# Patient Record
Sex: Male | Born: 1995 | Race: Black or African American | Hispanic: No | Marital: Single | Smoking: Never smoker
Health system: Southern US, Community
[De-identification: ages and names within clinical notes are randomized; demographics above are authoritative.]

## PROBLEM LIST (undated history)

## (undated) DIAGNOSIS — M24111 Other articular cartilage disorders, right shoulder: Secondary | ICD-10-CM

## (undated) DIAGNOSIS — M24419 Recurrent dislocation, unspecified shoulder: Secondary | ICD-10-CM

## (undated) DIAGNOSIS — T07XXXA Unspecified multiple injuries, initial encounter: Secondary | ICD-10-CM

## (undated) HISTORY — PX: NO PAST SURGERIES: SHX2092

---
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
## 2014-12-07 ENCOUNTER — Emergency Department (HOSPITAL_COMMUNITY)
Admission: EM | Admit: 2014-12-07 | Discharge: 2014-12-07 | Disposition: A | Discharge status: Home / Self Care | Payer: Medicaid Other | Attending: Emergency Medicine | Admitting: Emergency Medicine

## 2014-12-07 ENCOUNTER — Encounter (HOSPITAL_COMMUNITY): Payer: Self-pay | Admitting: Emergency Medicine

## 2014-12-07 ENCOUNTER — Emergency Department (HOSPITAL_COMMUNITY): Payer: Medicaid Other

## 2014-12-07 DIAGNOSIS — Y998 Other external cause status: Secondary | ICD-10-CM | POA: Insufficient documentation

## 2014-12-07 DIAGNOSIS — F419 Anxiety disorder, unspecified: Secondary | ICD-10-CM | POA: Insufficient documentation

## 2014-12-07 DIAGNOSIS — X58XXXA Exposure to other specified factors, initial encounter: Secondary | ICD-10-CM | POA: Diagnosis not present

## 2014-12-07 DIAGNOSIS — Y9389 Activity, other specified: Secondary | ICD-10-CM | POA: Diagnosis not present

## 2014-12-07 DIAGNOSIS — S43004A Unspecified dislocation of right shoulder joint, initial encounter: Secondary | ICD-10-CM

## 2014-12-07 DIAGNOSIS — Y929 Unspecified place or not applicable: Secondary | ICD-10-CM | POA: Insufficient documentation

## 2014-12-07 MED ORDER — HYDROCODONE-ACETAMINOPHEN 5-325 MG PO TABS: 1.0000 | ORAL_TABLET | Freq: Four times a day (QID) | ORAL | Status: DC | PRN

## 2014-12-07 MED ORDER — PROPOFOL 10 MG/ML IV BOLUS: 0.5000 mg/kg | Freq: Once | INTRAVENOUS | Status: AC

## 2014-12-07 MED ORDER — PROPOFOL 10 MG/ML IV BOLUS
INTRAVENOUS | Status: AC
  Filled 2014-12-07: qty 1

## 2014-12-07 MED ORDER — FENTANYL CITRATE (PF) 100 MCG/2ML IJ SOLN
100.0000 ug | Freq: Once | INTRAMUSCULAR | Status: AC
  Administered 2014-12-07: 100 ug via INTRAVENOUS
  Filled 2014-12-07: qty 2

## 2014-12-07 MED ORDER — PROPOFOL 10 MG/ML IV BOLUS
INTRAVENOUS | Status: AC | PRN
  Administered 2014-12-07: 20 mg via INTRAVENOUS
  Administered 2014-12-07: 60 mg via INTRAVENOUS

## 2014-12-07 NOTE — Sedation Documentation (Signed)
Pt conversing appropriately, baseline mentation however still drowsy, pt admits to improvement in pain.

## 2014-12-07 NOTE — ED Notes (Signed)
Pt was reaching for phone tonight and says, "I guess I reached too far." Has dislocated right shoulder in the past. Obvious deformity. No other c/c.

## 2014-12-07 NOTE — Discharge Instructions (Signed)
Shoulder Dislocation  Your shoulder is made up of three bones: the collar bone (clavicle); the shoulder blade (scapula), which includes the socket (glenoid cavity); and the upper arm bone (humerus). Your shoulder joint is the place where these bones meet. Strong, fibrous tissues hold these bones together (ligaments). Muscles and strong, fibrous tissues that connect the muscles to these bones (tendons) allow your arm to move through this joint. The range of motion of your shoulder joint is more extensive than most of your other joints, and the glenoid cavity is very shallow. That is the reason that your shoulder joint is one of the most unstable joints in your body. It is far more prone to dislocation than your other joints. Shoulder dislocation is when your humerus is forced out of your shoulder joint.  CAUSES  Shoulder dislocation is caused by a forceful impact on your shoulder. This impact usually is from an injury, such as a sports injury or a fall.  SYMPTOMS  Symptoms of shoulder dislocation include:  · Deformity of your shoulder.  · Intense pain.  · Inability to move your shoulder joint.  · Numbness, weakness, or tingling around your shoulder joint (your neck or down your arm).  · Bruising or swelling around your shoulder.  DIAGNOSIS  In order to diagnose a dislocated shoulder, your caregiver will perform a physical exam. Your caregiver also may have an X-ray exam done to see if you have any broken bones. Magnetic resonance imaging (MRI) is a procedure that sometimes is done to help your caregiver see any damage to the soft tissues around your shoulder, particularly your rotator cuff tendons. Additionally, your caregiver also may have electromyography done to measure the electrical discharges produced in your muscles if you have signs or symptoms of nerve damage.  TREATMENT  A shoulder dislocation is treated by placing the humerus back in the joint (reduction). Your caregiver does this either manually (closed  reduction), by moving your humerus back into the joint through manipulation, or through surgery (open reduction). When your humerus is back in place, severe pain should improve almost immediately.  You also may need to have surgery if you have a weak shoulder joint or ligaments, and you have recurring shoulder dislocations, despite rehabilitation. In rare cases, surgery is necessary if your nerves or blood vessels are damaged during the dislocation.  After your reduction, your arm will be placed in a shoulder immobilizer or sling to keep it from moving. Your caregiver will have you wear your shoulder immobilizer or sling for 3 days to 3 weeks, depending on how serious your dislocation is. When your shoulder immobilizer or sling is removed, your caregiver may prescribe physical therapy to help improve the range of motion in your shoulder joint.  HOME CARE INSTRUCTIONS   The following measures can help to reduce pain and speed up the healing process:  · Rest your injured joint. Do not move it. Avoid activities similar to the one that caused your injury.  · Apply ice to your injured joint for the first day or two after your reduction or as directed by your caregiver. Applying ice helps to reduce inflammation and pain.  ¨ Put ice in a plastic bag.  ¨ Place a towel between your skin and the bag.  ¨ Leave the ice on for 15-20 minutes at a time, every 2 hours while you are awake.  · Exercise your hand by squeezing a soft ball. This helps to eliminate stiffness and swelling in your hand and   wrist.  · Take over-the-counter or prescription medicine for pain or discomfort as told by your caregiver.  SEEK IMMEDIATE MEDICAL CARE IF:   · Your shoulder immobilizer or sling becomes damaged.  · Your pain becomes worse rather than better.  · You lose feeling in your arm or hand, or they become white and cold.  MAKE SURE YOU:   · Understand these instructions.  · Will watch your condition.  · Will get help right away if you are not  doing well or get worse.  Document Released: 03/31/2001 Document Revised: 11/20/2013 Document Reviewed: 04/26/2011  [employer]® Patient Information ©2015 [employer]. This information is not intended to replace advice given to you by your health care provider. Make sure you discuss any questions you have with your health care provider.

## 2014-12-07 NOTE — Sedation Documentation (Signed)
Right shoulder successfully reduced, pt tolerated procedure well.

## 2014-12-07 NOTE — ED Notes (Signed)
Arm sling in place. AVS explained in detail. No other c/c. Knows to follow up with Orthopedic surgeon. No other c/c.

## 2014-12-07 NOTE — ED Provider Notes (Signed)
CSN: [id number]     Arrival date & time 12/07/14  0020 History   First MD Initiated Contact with Patient 12/07/14 [phone]     Chief Complaint  Patient presents with  . Dislocation    shoulder, right     (Consider location/radiation/quality/duration/timing/severity/associated sxs/prior Treatment) HPI Patient presents with right shoulder dislocation. States he's had multiple dislocations in the past. Was unable to reduce the shoulder himself. States he was reaching for an object when he felt the shoulder come out of place. He has decreased range of motion of the right shoulder. No numbness. No other injury. History reviewed. No pertinent past medical history. History reviewed. No pertinent past surgical history. History reviewed. No pertinent family history. History  Substance Use Topics  . Smoking status: Never Smoker   . Smokeless tobacco: Not on file  . Alcohol Use: No    Review of Systems  Constitutional: Negative for fever and chills.  Respiratory: Negative for shortness of breath.   Cardiovascular: Negative for chest pain.  Gastrointestinal: Negative for nausea, vomiting and abdominal pain.  Musculoskeletal: Positive for arthralgias. Negative for myalgias, neck pain and neck stiffness.  Skin: Negative for rash and wound.  Neurological: Negative for dizziness, weakness, light-headedness, numbness and headaches.  All other systems reviewed and are negative.     Allergies  Review of patient's allergies indicates no known allergies.  Home Medications   Prior to Admission medications   Medication Sig Start Date End Date Taking? Authorizing Provider  ibuprofen (ADVIL,MOTRIN) 200 MG tablet Take 400 mg by mouth every 6 (six) hours as needed for moderate pain.   Yes Historical Provider, MD  HYDROcodone-acetaminophen (NORCO/VICODIN) 5-325 MG per tablet Take 1 tablet by mouth every 6 (six) hours as needed for severe pain. 12/07/14   Loren Raceravid Donnalynn Wheeless, MD  ibuprofen (ADVIL,MOTRIN) 800  MG tablet Take 1 tablet (800 mg total) by mouth 3 (three) times daily. Patient not taking: Reported on 12/07/2014 07/20/13   Fayrene HelperBowie Tran, PA-C   BP 139/62 mmHg  Pulse 56  Temp(Src) 97.7 F (36.5 C) (Oral)  Resp 18  Ht 5\' 8"  (1.727 m)  Wt 165 lb (74.844 kg)  BMI 25.09 kg/m2  SpO2 97% Physical Exam  Constitutional: He is oriented to person, place, and time. He appears well-developed and well-nourished. No distress.  Anxious appearing  HENT:  Head: Normocephalic and atraumatic.  Mouth/Throat: Oropharynx is clear and moist.  Eyes: EOM are normal. Pupils are equal, round, and reactive to light.  Neck: Normal range of motion. Neck supple.  Cardiovascular: Normal rate and regular rhythm.   Pulmonary/Chest: Effort normal and breath sounds normal. No respiratory distress. He has no wheezes. He has no rales. He exhibits no tenderness.  Abdominal: Soft. Bowel sounds are normal. He exhibits no distension and no mass. There is no tenderness. There is no rebound and no guarding.  Musculoskeletal: Normal range of motion. He exhibits no edema or tenderness.  Deformity of the right shoulder. Decreased range of motion. Distal pulses intact. Full range of motion of the right elbow and wrist.   Neurological: He is alert and oriented to person, place, and time.  5/5 grip strength bilaterally. Sensation is fully intact.  Skin: Skin is warm and dry. No rash noted. No erythema.  Psychiatric: He has a normal mood and affect. His behavior is normal.  Nursing note and vitals reviewed.   ED Course  Reduction of dislocation Date/Time: 12/07/2014 1:45 AM Performed by: Loren RacerYELVERTON, Tyjae Issa Authorized by: Loren RacerYELVERTON, Saqib Cazarez Consent: Written consent  obtained. Time out: Immediately prior to procedure a "time out" was called to verify the correct patient, procedure, equipment, support staff and site/side marked as required. Local anesthesia used: no Patient sedated: yes Sedatives: propofol Sedation start date/time:  12/07/2014 1:45 AM Sedation end date/time: 12/07/2014 1:55 AM Vitals: Vital signs were monitored during sedation. Patient tolerance: Patient tolerated the procedure well with no immediate complications Comments: Shoulder immobilizer placed after manual reduction   (including critical care time) Labs Review Labs Reviewed - No data to display  Imaging Review Dg Shoulder Right  12/07/2014   CLINICAL DATA:  Right shoulder dislocation postreduction.  EXAM: RIGHT SHOULDER - 2+ VIEW  COMPARISON:  Pre reduction views earlier this day.  FINDINGS: Anatomic alignment of the glenohumeral joint postreduction. Suspect Hill-Sachs impaction injury. The acromioclavicular joint is congruent.  IMPRESSION: Anatomic alignment postreduction.   Electronically Signed   By: Rubye OaksMelanie  Ehinger M.D.   On: 12/07/2014 02:44   Dg Shoulder Right  12/07/2014   CLINICAL DATA:  Dislocated shoulder while reaching for phone this afternoon, history of shoulder dislocation.  EXAM: RIGHT SHOULDER - 2+ VIEW  COMPARISON:  None.  FINDINGS: RIGHT anteroinferior shoulder dislocation without fracture deformity. No destructive bony lesions. Soft tissue planes are unremarkable.  IMPRESSION: RIGHT anteroinferior shoulder dislocation without fracture deformity.   Electronically Signed   By: Awilda Metroourtnay  Bloomer   On: 12/07/2014 01:19     EKG Interpretation None      MDM   Final diagnoses:  Shoulder dislocation, right, initial encounter   Patient thought advised to follow-up with orthopedist. Return precautions given.     Loren Raceravid Tiwatope Emmitt, MD 12/07/14 [phone]

## 2015-02-17 ENCOUNTER — Emergency Department (HOSPITAL_BASED_OUTPATIENT_CLINIC_OR_DEPARTMENT_OTHER): Payer: Medicaid Other

## 2015-02-17 ENCOUNTER — Emergency Department (HOSPITAL_BASED_OUTPATIENT_CLINIC_OR_DEPARTMENT_OTHER)
Admission: EM | Admit: 2015-02-17 | Discharge: 2015-02-17 | Disposition: A | Discharge status: Home / Self Care | Payer: Medicaid Other | Attending: Emergency Medicine | Admitting: Emergency Medicine

## 2015-02-17 ENCOUNTER — Encounter (HOSPITAL_BASED_OUTPATIENT_CLINIC_OR_DEPARTMENT_OTHER): Payer: Self-pay | Admitting: *Deleted

## 2015-02-17 DIAGNOSIS — Y929 Unspecified place or not applicable: Secondary | ICD-10-CM | POA: Diagnosis not present

## 2015-02-17 DIAGNOSIS — S4991XA Unspecified injury of right shoulder and upper arm, initial encounter: Secondary | ICD-10-CM | POA: Diagnosis present

## 2015-02-17 DIAGNOSIS — Y998 Other external cause status: Secondary | ICD-10-CM | POA: Insufficient documentation

## 2015-02-17 DIAGNOSIS — Y9389 Activity, other specified: Secondary | ICD-10-CM | POA: Diagnosis not present

## 2015-02-17 DIAGNOSIS — S43004A Unspecified dislocation of right shoulder joint, initial encounter: Secondary | ICD-10-CM

## 2015-02-17 DIAGNOSIS — X58XXXA Exposure to other specified factors, initial encounter: Secondary | ICD-10-CM | POA: Diagnosis not present

## 2015-02-17 DIAGNOSIS — S43014A Anterior dislocation of right humerus, initial encounter: Secondary | ICD-10-CM | POA: Diagnosis not present

## 2015-02-17 DIAGNOSIS — R001 Bradycardia, unspecified: Secondary | ICD-10-CM | POA: Insufficient documentation

## 2015-02-17 HISTORY — DX: Recurrent dislocation, unspecified shoulder: M24.419

## 2015-02-17 MED ORDER — HYDROMORPHONE HCL 1 MG/ML IJ SOLN
1.0000 mg | Freq: Once | INTRAMUSCULAR | Status: AC
  Administered 2015-02-17: 1 mg via INTRAVENOUS
  Filled 2015-02-17: qty 1

## 2015-02-17 NOTE — Discharge Instructions (Signed)
Please see orthopaedics! Wear sling as directed.  If you were given medicines take as directed.  If you are on coumadin or contraceptives realize their levels and effectiveness is altered by many different medicines.  If you have any reaction (rash, tongues swelling, other) to the medicines stop taking and see a physician.    If your blood pressure was elevated in the ER make sure you follow up for management with a primary doctor or return for chest pain, shortness of breath or stroke symptoms.  Please follow up as directed and return to the ER or see a physician for new or worsening symptoms.  Thank you. Filed Vitals:   02/17/15 2124 02/17/15 2251  BP: 137/82 141/73  Pulse: 58 51  Temp: 98.3 F (36.8 C)   TempSrc: Oral   Resp: 20 18  Height: 5' 8.5" (1.74 m)   Weight: 163 lb (73.936 kg)   SpO2: 99% 98%    Shoulder Dislocation  Shoulder dislocation is when your upper arm bone (humerus) is forced out of your shoulder joint. Your doctor will put your shoulder back into the joint by pulling on your arm or through surgery. Your arm will be placed in a shoulder immobilizer or sling. The shoulder immobilizer or sling holds your shoulder in place while it heals. HOME CARE   Rest your injured joint. Do not move it until instructed to do so.  Put ice on your injured joint as told by your doctor.  Put ice in a plastic bag.  Place a towel between your skin and the bag.  Leave the ice on for 15-20 minutes at a time, every 2 hours while you are awake.  Only take medicines as told by your doctor.  Squeeze a ball to exercise your hand. GET HELP RIGHT AWAY IF:   Your splint or sling becomes damaged.  Your pain becomes worse, not better.  You lose feeling in your arm or hand.  Your arm or hand becomes white or cold. MAKE SURE YOU:   Understand these instructions.  Will watch your condition.  Will get help right away if you are not doing well or get worse. Document Released:  09/28/2011 Document Reviewed: 09/28/2011 [facility] Patient Information 2015 [place], [place]. This information is not intended to replace advice given to you by your health care provider. Make sure you discuss any questions you have with your health care provider.

## 2015-02-17 NOTE — ED Notes (Signed)
I applied an immobilizer sling over paper scrub shirt. Patient comfort improved.

## 2015-02-17 NOTE — ED Provider Notes (Signed)
CSN: [id number]     Arrival date & time 02/17/15  2120 History  This chart was scribed for Blane Ohara, MD by Budd Palmer, ED Scribe. This patient was seen in room MH07/MH07 and the patient's care was started at 10:02 PM.    Chief Complaint  Patient presents with  . Shoulder Pain   The history is provided by the patient. No language interpreter was used.   HPI Comments: Warren Holland is a 19 y.o. male who presents to the Emergency Department complaining of right shoulder pain onset 2 hours ago. He states that the shoulder dislocated while he was lying down. He reports associated weakness and numbness of the right hand. She notes a PHx of shoulder dislocation, last episode 3 days ago. Usually he will pop it back in by himself. He has seen someone for this, but never set up a surgery appointment.  Past Medical History  Diagnosis Date  . Shoulder dislocation, recurrent    History reviewed. No pertinent past surgical history. No family history on file. History  Substance Use Topics  . Smoking status: Never Smoker   . Smokeless tobacco: Not on file  . Alcohol Use: No    Review of Systems  Musculoskeletal: Positive for myalgias and arthralgias.  Neurological: Positive for weakness and numbness.  All other systems reviewed and are negative.  Allergies  Review of patient's allergies indicates no known allergies.  Home Medications   Prior to Admission medications   Medication Sig Start Date End Date Taking? Authorizing Provider  HYDROcodone-acetaminophen (NORCO/VICODIN) 5-325 MG per tablet Take 1 tablet by mouth every 6 (six) hours as needed for severe pain. 12/07/14   Loren Racer, MD  ibuprofen (ADVIL,MOTRIN) 200 MG tablet Take 400 mg by mouth every 6 (six) hours as needed for moderate pain.    Historical Provider, MD  ibuprofen (ADVIL,MOTRIN) 800 MG tablet Take 1 tablet (800 mg total) by mouth 3 (three) times daily. Patient not taking: Reported on 12/07/2014 07/20/13   Fayrene Helper, PA-C   BP 141/73 mmHg  Pulse 51  Temp(Src) 98.3 F (36.8 C) (Oral)  Resp 18  Ht 5' 8.5" (1.74 m)  Wt 163 lb (73.936 kg)  BMI 24.42 kg/m2  SpO2 98% Physical Exam  Constitutional: He is oriented to person, place, and time. He appears well-developed and well-nourished. No distress.  HENT:  Head: Normocephalic and atraumatic.  Mouth/Throat: Oropharynx is clear and moist.  Eyes: Conjunctivae and EOM are normal. Pupils are equal, round, and reactive to light.  Neck: Normal range of motion. Neck supple. No tracheal deviation present.  Cardiovascular: Bradycardia present.   Pulmonary/Chest: Breath sounds normal. No respiratory distress.  Abdominal: Soft.  Musculoskeletal: Normal range of motion. He exhibits tenderness.  Right shoulder anterior, empty fossa, tender to palpation, internal rotated, nv intact distal  Neurological: He is alert and oriented to person, place, and time.  Skin: Skin is warm and dry.  Psychiatric: He has a normal mood and affect. His behavior is normal.  Nursing note and vitals reviewed.   ED Course  Reduction of dislocation Date/Time: 02/17/2015 11:07 PM Performed by: Blane Ohara Authorized by: Blane Ohara Consent: Verbal consent obtained. Consent given by: patient Patient understanding: patient states understanding of the procedure being performed Patient consent: the patient's understanding of the procedure matches consent given Patient identity confirmed: verbally with patient Local anesthesia used: no Patient sedated: no Patient tolerance: Patient tolerated the procedure well with no immediate complications Comments: Muscle relaxation technique, traction and ext  rotation right shoulder     DIAGNOSTIC STUDIES: Oxygen Saturation is 99% on RA, normal by my interpretation.    COORDINATION OF CARE: 10:18 PM - Tried to readjust the shoulder. Discussed plans to XR to confirm sucess. If unsuccessful, will give medication and re-attempt. Pt  advised of plan for treatment and pt agrees.  Labs Review Labs Reviewed - No data to display  Imaging Review Dg Shoulder Right  02/17/2015   CLINICAL DATA:  Postreduction.  EXAM: RIGHT SHOULDER - 2+ VIEW  COMPARISON:  Pre reduction earlier this day.  FINDINGS: Anatomic alignment postreduction. Probable Hill-Sachs impaction injury. No displaced bony [place]. The acromioclavicular joint is congruent.  IMPRESSION: Normal alignment postreduction. Probable Hill-Sachs impaction injury.   Electronically Signed   By: Rubye Oaks M.D.   On: 02/17/2015 22:55   Dg Shoulder Right  02/17/2015   CLINICAL DATA:  Right shoulder pain.  Previous dislocation.  EXAM: RIGHT SHOULDER - 2+ VIEW  COMPARISON:  12/07/2014  FINDINGS: Examination demonstrates anterior dislocation of the humeral head at the glenohumeral joint. No evidence of fracture.  IMPRESSION: Right shoulder anterior dislocation.   Electronically Signed   By: Elberta Fortis M.D.   On: 02/17/2015 22:15     EKG Interpretation None      MDM   Final diagnoses:  Shoulder dislocation, right, initial encounter   Patient presented with recurrent shoulder dislocation, low risk mechanism. Stressed follow-up with orthopedics, patient has not followed up in the past. Patient was reduced at bedside with muscle activation technique and a dose of pain medicine. Clinically and repeat x-ray reviewed showing normal alignment with possible Hill-Sachs deformity. Results and differential diagnosis were discussed with the patient/parent/guardian. Xrays were independently reviewed by myself.  Close follow up outpatient was discussed, comfortable with the plan.   Medications  HYDROmorphone (DILAUDID) injection 1 mg (1 mg Intravenous Given 02/17/15 2158)    Filed Vitals:   02/17/15 2124 02/17/15 2251  BP: 137/82 141/73  Pulse: 58 51  Temp: 98.3 F (36.8 C)   TempSrc: Oral   Resp: 20 18  Height: 5' 8.5" (1.74 m)   Weight: 163 lb (73.936 kg)   SpO2: 99%  98%    Final diagnoses:  Shoulder dislocation, right, initial encounter      Blane Ohara, MD 02/17/15 2307

## 2015-02-17 NOTE — ED Notes (Signed)
Pt here with pain and obvious deformity of right shoulder.  Pt has had shoulder dislocated 3 days ago already

## 2015-02-18 DIAGNOSIS — M24419 Recurrent dislocation, unspecified shoulder: Secondary | ICD-10-CM

## 2015-02-18 DIAGNOSIS — M24111 Other articular cartilage disorders, right shoulder: Secondary | ICD-10-CM

## 2015-02-18 HISTORY — DX: Other articular cartilage disorders, right shoulder: M24.111

## 2015-02-18 HISTORY — DX: Recurrent dislocation, unspecified shoulder: M24.419

## 2015-02-25 ENCOUNTER — Emergency Department (HOSPITAL_COMMUNITY): Payer: Medicaid Other

## 2015-02-25 ENCOUNTER — Encounter (HOSPITAL_COMMUNITY): Payer: Self-pay | Admitting: Emergency Medicine

## 2015-02-25 ENCOUNTER — Emergency Department (HOSPITAL_COMMUNITY)
Admission: EM | Admit: 2015-02-25 | Discharge: 2015-02-25 | Disposition: A | Discharge status: Home / Self Care | Payer: Medicaid Other | Attending: Emergency Medicine | Admitting: Emergency Medicine

## 2015-02-25 DIAGNOSIS — S43031A Inferior subluxation of right humerus, initial encounter: Secondary | ICD-10-CM | POA: Diagnosis not present

## 2015-02-25 DIAGNOSIS — S43011A Anterior subluxation of right humerus, initial encounter: Secondary | ICD-10-CM | POA: Insufficient documentation

## 2015-02-25 DIAGNOSIS — Y939 Activity, unspecified: Secondary | ICD-10-CM | POA: Insufficient documentation

## 2015-02-25 DIAGNOSIS — S4991XA Unspecified injury of right shoulder and upper arm, initial encounter: Secondary | ICD-10-CM | POA: Diagnosis present

## 2015-02-25 DIAGNOSIS — X58XXXA Exposure to other specified factors, initial encounter: Secondary | ICD-10-CM | POA: Diagnosis not present

## 2015-02-25 DIAGNOSIS — S43004A Unspecified dislocation of right shoulder joint, initial encounter: Secondary | ICD-10-CM

## 2015-02-25 DIAGNOSIS — Y929 Unspecified place or not applicable: Secondary | ICD-10-CM | POA: Insufficient documentation

## 2015-02-25 DIAGNOSIS — Y998 Other external cause status: Secondary | ICD-10-CM | POA: Insufficient documentation

## 2015-02-25 MED ORDER — PROPOFOL 10 MG/ML IV BOLUS
100.0000 mg | Freq: Once | INTRAVENOUS | Status: AC
  Administered 2015-02-25: 80 mg via INTRAVENOUS
  Filled 2015-02-25: qty 1

## 2015-02-25 MED ORDER — LIDOCAINE-EPINEPHRINE 1 %-1:100000 IJ SOLN
10.0000 mL | Freq: Once | INTRAMUSCULAR | Status: DC
  Filled 2015-02-25: qty 1

## 2015-02-25 MED ORDER — PROPOFOL 10 MG/ML IV BOLUS
60.0000 mg | Freq: Once | INTRAVENOUS | Status: AC
  Administered 2015-02-25: 150 mg via INTRAVENOUS
  Filled 2015-02-25: qty 1

## 2015-02-25 MED ORDER — LIDOCAINE HCL (PF) 1 % IJ SOLN
30.0000 mL | Freq: Once | INTRAMUSCULAR | Status: DC
  Filled 2015-02-25: qty 30

## 2015-02-25 MED ORDER — LIDOCAINE HCL 2 % IJ SOLN
INTRAMUSCULAR | Status: AC
  Administered 2015-02-25: 800 mg
  Filled 2015-02-25: qty 20

## 2015-02-25 MED ORDER — KETAMINE HCL 10 MG/ML IJ SOLN
40.0000 mg | Freq: Once | INTRAMUSCULAR | Status: AC
  Administered 2015-02-25: 40 mg via INTRAVENOUS
  Filled 2015-02-25: qty 4

## 2015-02-25 MED ORDER — MIDAZOLAM HCL 2 MG/2ML IJ SOLN
5.0000 mg | Freq: Once | INTRAMUSCULAR | Status: AC
  Administered 2015-02-25: 5 mg via INTRAVENOUS
  Filled 2015-02-25: qty 6

## 2015-02-25 MED ORDER — ONDANSETRON HCL 4 MG/2ML IJ SOLN
4.0000 mg | Freq: Once | INTRAMUSCULAR | Status: AC
  Administered 2015-02-25: 4 mg via INTRAVENOUS
  Filled 2015-02-25: qty 2

## 2015-02-25 MED ORDER — HYDROMORPHONE HCL 1 MG/ML IJ SOLN
0.5000 mg | INTRAMUSCULAR | Status: DC | PRN
  Administered 2015-02-25: 0.5 mg via INTRAVENOUS
  Filled 2015-02-25: qty 1

## 2015-02-25 MED ORDER — KETAMINE HCL 10 MG/ML IJ SOLN
45.0000 mg | Freq: Once | INTRAMUSCULAR | Status: AC
  Administered 2015-02-25: 45 mg via INTRAVENOUS
  Filled 2015-02-25: qty 4.5

## 2015-02-25 MED ORDER — HYDROCODONE-ACETAMINOPHEN 5-325 MG PO TABS: 1.0000 | ORAL_TABLET | ORAL | Status: DC | PRN

## 2015-02-25 NOTE — ED Notes (Addendum)
Pt awake. Verbally responsive. A/O x4. Resp even and unlabored. No audible adventitious breath sounds noted. Pt able to move unaffected ext. RASS score 14. GCS-14.

## 2015-02-25 NOTE — ED Notes (Addendum)
1005-time out procedure completed.VS:BP:136/84, HR-74, RR-18, Sats-98% RA, Cap-35 1009-Moderate sedation started. Ketamine  IV push given via Dr. Clarene Duke 1010-Propofol  IV push given via Dr. Clarene Duke 1015-Dr. Little administered Propofol  IV push. 1020-Dr. Little administered Propofol  IV push. 1021-Dr. Little administered Propofol  IV push. Monitor VS throughout procedure. Dr. Clarene Duke and Donald Siva tech at bedside to attempt to reduce rt shoulder without success. Pt tolerated procedure well.

## 2015-02-25 NOTE — ED Notes (Addendum)
Dr. Clarene Duke at bedside attempting to do reduction to rt shoulder.

## 2015-02-25 NOTE — ED Notes (Signed)
Pt given verbally consent to this nurse and Dr. Clarene Duke prior to administering Versed for rt shoulder reduction.

## 2015-02-25 NOTE — Progress Notes (Signed)
Medicaid [place] access response history indicates pt''s pcp is  [facility] [employer], [place]  [address] [place], [place] [address] [phone]  EPIC updated

## 2015-02-25 NOTE — Discharge Instructions (Signed)
Shoulder Dislocation  Your shoulder is made up of three bones: the collar bone (clavicle); the shoulder blade (scapula), which includes the socket (glenoid cavity); and the upper arm bone (humerus). Your shoulder joint is the place where these bones meet. Strong, fibrous tissues hold these bones together (ligaments). Muscles and strong, fibrous tissues that connect the muscles to these bones (tendons) allow your arm to move through this joint. The range of motion of your shoulder joint is more extensive than most of your other joints, and the glenoid cavity is very shallow. That is the reason that your shoulder joint is one of the most unstable joints in your body. It is far more prone to dislocation than your other joints. Shoulder dislocation is when your humerus is forced out of your shoulder joint.  CAUSES  Shoulder dislocation is caused by a forceful impact on your shoulder. This impact usually is from an injury, such as a sports injury or a fall.  SYMPTOMS  Symptoms of shoulder dislocation include:  · Deformity of your shoulder.  · Intense pain.  · Inability to move your shoulder joint.  · Numbness, weakness, or tingling around your shoulder joint (your neck or down your arm).  · Bruising or swelling around your shoulder.  DIAGNOSIS  In order to diagnose a dislocated shoulder, your caregiver will perform a physical exam. Your caregiver also may have an X-ray exam done to see if you have any broken bones. Magnetic resonance imaging (MRI) is a procedure that sometimes is done to help your caregiver see any damage to the soft tissues around your shoulder, particularly your rotator cuff tendons. Additionally, your caregiver also may have electromyography done to measure the electrical discharges produced in your muscles if you have signs or symptoms of nerve damage.  TREATMENT  A shoulder dislocation is treated by placing the humerus back in the joint (reduction). Your caregiver does this either manually (closed  reduction), by moving your humerus back into the joint through manipulation, or through surgery (open reduction). When your humerus is back in place, severe pain should improve almost immediately.  You also may need to have surgery if you have a weak shoulder joint or ligaments, and you have recurring shoulder dislocations, despite rehabilitation. In rare cases, surgery is necessary if your nerves or blood vessels are damaged during the dislocation.  After your reduction, your arm will be placed in a shoulder immobilizer or sling to keep it from moving. Your caregiver will have you wear your shoulder immobilizer or sling for 3 days to 3 weeks, depending on how serious your dislocation is. When your shoulder immobilizer or sling is removed, your caregiver may prescribe physical therapy to help improve the range of motion in your shoulder joint.  HOME CARE INSTRUCTIONS   The following measures can help to reduce pain and speed up the healing process:  · Rest your injured joint. Do not move it. Avoid activities similar to the one that caused your injury.  · Apply ice to your injured joint for the first day or two after your reduction or as directed by your caregiver. Applying ice helps to reduce inflammation and pain.  ¨ Put ice in a plastic bag.  ¨ Place a towel between your skin and the bag.  ¨ Leave the ice on for 15-20 minutes at a time, every 2 hours while you are awake.  · Exercise your hand by squeezing a soft ball. This helps to eliminate stiffness and swelling in your hand and   wrist.  · Take over-the-counter or prescription medicine for pain or discomfort as told by your caregiver.  SEEK IMMEDIATE MEDICAL CARE IF:   · Your shoulder immobilizer or sling becomes damaged.  · Your pain becomes worse rather than better.  · You lose feeling in your arm or hand, or they become white and cold.  MAKE SURE YOU:   · Understand these instructions.  · Will watch your condition.  · Will get help right away if you are not  doing well or get worse.  Document Released: 03/31/2001 Document Revised: 11/20/2013 Document Reviewed: 04/26/2011  [employer]® Patient Information ©2015 [employer]. This information is not intended to replace advice given to you by your health care provider. Make sure you discuss any questions you have with your health care provider.

## 2015-02-25 NOTE — ED Notes (Signed)
Awake. Verbally responsive. A/O x4. Resp even and unlabored. No audible adventitious breath sounds noted. ABC's intact.  

## 2015-02-25 NOTE — ED Notes (Signed)
Dr. Linna Caprice at bedside.

## 2015-02-25 NOTE — ED Notes (Signed)
Patient states he woke up this AM, stretched and dislocated his right shoulder. Patient states he has had this to happen before.

## 2015-02-25 NOTE — ED Provider Notes (Addendum)
CSN: [id number]     Arrival date & time 02/25/15  4098 History   First MD Initiated Contact with Patient 02/25/15 [phone]     Chief Complaint  Patient presents with  . Shoulder Injury     (Consider location/radiation/quality/duration/timing/severity/associated sxs/prior Treatment) HPI Comments: 19yo M w/ h/o R shoulder dislocation p/w R shoulder pain. Just prior to arrival, the patient woke up from sleep and dislocated his R shoulder. He denies any trauma but states that he was lying down in bed when it happened. Pain is severe and constant, worse with arm movement. Last dislocation was 3 days ago. He has not yet set up an appointment with orthopaedics but was trying to get PCP appt for referral.  Patient is a 19 y.o. male presenting with shoulder injury. The history is provided by the patient.  Shoulder Injury    Past Medical History  Diagnosis Date  . Shoulder dislocation, recurrent    History reviewed. No pertinent past surgical history. History reviewed. No pertinent family history. History  Substance Use Topics  . Smoking status: Never Smoker   . Smokeless tobacco: Not on file  . Alcohol Use: No    Review of Systems  10 Systems reviewed and are negative for acute change except as noted in the HPI.   Allergies  Review of patient's allergies indicates no known allergies.  Home Medications   Prior to Admission medications   Medication Sig Start Date End Date Taking? Authorizing Provider  HYDROcodone-acetaminophen (NORCO/VICODIN) 5-325 MG per tablet Take 1 tablet by mouth every 4 (four) hours as needed. 02/25/15   Laurence Spates, MD  ibuprofen (ADVIL,MOTRIN) 800 MG tablet Take 1 tablet (800 mg total) by mouth 3 (three) times daily. Patient not taking: Reported on 12/07/2014 07/20/13   Fayrene Helper, PA-C   BP 127/68 mmHg  Pulse 62  Temp(Src) 97.9 F (36.6 C) (Oral)  Resp 18  SpO2 99% Physical Exam  Constitutional: He is oriented to person, place, and time. He appears  well-developed and well-nourished. No distress.  Holding R arm, in distress due to pain  HENT:  Head: Normocephalic and atraumatic.  Moist mucous membranes  Eyes: Conjunctivae are normal. Pupils are equal, round, and reactive to light.  Neck: Neck supple.  Cardiovascular: Normal rate, regular rhythm and normal heart sounds.   No murmur heard. Pulmonary/Chest: Effort normal and breath sounds normal.  Abdominal: Soft. Bowel sounds are normal. He exhibits no distension. There is no tenderness.  Musculoskeletal: He exhibits no edema.  R arm held in abduction and flexed at elbow; obvious deformity to R shoulder w/ asymmetry compared to L; 2+ radial pulses and normal sensation throughout  Neurological: He is alert and oriented to person, place, and time.  Fluent speech  Skin: Skin is warm and dry.  Psychiatric: Judgment normal.  distressed  Nursing note and vitals reviewed.   ED Course  Procedural sedation Date/Time: 02/25/2015 10:20 AM Performed by: Laurence Spates Authorized by: Laurence Spates Consent: Verbal consent obtained. Written consent obtained. Risks and benefits: risks, benefits and alternatives were discussed Consent given by: patient Patient understanding: patient states understanding of the procedure being performed Patient consent: the patient's understanding of the procedure matches consent given Procedure consent: procedure consent matches procedure scheduled Relevant documents: relevant documents present and verified Patient identity confirmed: verbally with patient and arm band Time out: Immediately prior to procedure a "time out" was called to verify the correct patient, procedure, equipment, support staff and site/side marked as required.  Local anesthesia used: no Patient sedated: yes Sedatives: ketamine and propofol Vitals: Vital signs were monitored during sedation. Patient tolerance: Patient tolerated the procedure well with no immediate  complications Comments: Initial sedation performed with 40mg  IV ketamine and 120mg  IV propofol; multiple attempts made at reduction of shoulder without success  Length of time: 15 minutes   Procedural sedation Date/Time: 02/25/2015 13:00 PM Performed by: Laurence Spates Authorized by: Laurence Spates Consent: Verbal consent obtained. Written consent obtained. Risks and benefits: risks, benefits and alternatives were discussed Consent given by: patient Patient understanding: patient states understanding of the procedure being performed Patient consent: the patient's understanding of the procedure matches consent given Procedure consent: procedure consent matches procedure scheduled Relevant documents: relevant documents present and verified Patient identity confirmed: verbally with patient and arm band Time out: Immediately prior to procedure a "time out" was called to verify the correct patient, procedure, equipment, support staff and site/side marked as required. Local anesthesia used: no Patient sedated: yes Sedatives: ketamine and propofol Vitals: Vital signs were monitored during sedation. Length of time: 5 minutes Patient tolerance: Patient tolerated the procedure well with no immediate complications Comments:sedation performed with ketamine and propofol; orthopaedic surgeon at bedside successfully reduced shoulder at bedside, confirmed with repeat XR.   Labs Review Labs Reviewed - No data to display  Imaging Review Dg Shoulder Right  02/25/2015   CLINICAL DATA:  Right shoulder dislocation  EXAM: RIGHT SHOULDER - 2+ VIEW  COMPARISON:  02/17/2015  FINDINGS: Two views of the right shoulder submitted. There is recurrent anterior inferior right shoulder subluxation. No fracture is noted.  IMPRESSION: Recurrent anterior inferior right shoulder subluxation. No acute fracture.   Electronically Signed   By: Natasha Mead M.D.   On: 02/25/2015 09:23   Dg Shoulder Right  Port  02/25/2015   CLINICAL DATA:  19 year old male post reduction. Subsequent encounter.  EXAM: PORTABLE RIGHT SHOULDER - 2+ VIEW  COMPARISON:  02/25/2015 right shoulder films.  FINDINGS: Reduction right humeral head.  Hill-Sachs deformity.  Visualized lungs clear.  IMPRESSION: Reduction right humeral head.  Hill-Sachs deformity.   Electronically Signed   By: Lacy Duverney M.D.   On: 02/25/2015 13:10     EKG Interpretation None      MDM   Final diagnoses:  Shoulder joint dislocation, right, initial encounter  Hill-Sachs deformity  19yo M w/ recurrent R shoulder dislocation p/w atraumatic dislocation that occurred while he was in bed this morning. Patient with obvious dislocation compared to left shoulder on exam. Neurovascularly intact. Attempted relocation with 5 mg IV Versed, 10 mg intra-articular 1% lidocaine, and massage/abduction/external rotation without success. Obtained plain films of shoulder and Consented for procedural sedation. Initial procedural sedation performed with ketamine and propofol and I was unable to reduce the shoulder using traction-countertraction with the assistance of the Ortho tech. Consult orthopedics for evaluation. Dr. Linna Caprice reviewed films and we performed a second sedation during which he successfully relocated the right shoulder. Place the patient in a sling. Repeat films show successful relocation with a Hill-Sachs deformity. He provided the family with follow-up for shoulder specialist with [facility] orthopedics. Patient tolerated sedation well and was awake and well-appearing at time of discharge. Return precautions reviewed. Patient discharged in satisfactory condition.  Laurence Spates, MD 02/25/15 1727  Laurence Spates, MD 03/15/15 [id number]

## 2015-02-25 NOTE — Consult Note (Signed)
   ORTHOPAEDIC CONSULTATION  REQUESTING PHYSICIAN: Laurence Spates, MD  PCP:  Default, Provider, MD  Chief Complaint: recurrent right shoulder dislocation  HPI: Warren Holland is a 19 y.o. male who complains of right shoulder dislocation. Has multiple dislocations; he can usually reduce himself. This am, dislocation happened while stretching. First dislocation was 3 years ago whole playing football. Had closed reduction a few days ago; has never seen orthopaedic surgeon. Unsuccessful attempt at CR in the ED earlier today.  Past Medical History  Diagnosis Date  . Shoulder dislocation, recurrent    History reviewed. No pertinent past surgical history. History   Social History  . Marital Status: Single    Spouse Name: N/A  . Number of Children: N/A  . Years of Education: N/A   Social History Main Topics  . Smoking status: Never Smoker   . Smokeless tobacco: Not on file  . Alcohol Use: No  . Drug Use: No  . Sexual Activity: Not on file   Other Topics Concern  . None   Social History Narrative   History reviewed. No pertinent family history. No Known Allergies Prior to Admission medications   Medication Sig Start Date End Date Taking? Authorizing Provider  HYDROcodone-acetaminophen (NORCO/VICODIN) 5-325 MG per tablet Take 1 tablet by mouth every 6 (six) hours as needed for severe pain. Patient not taking: Reported on 02/25/2015 12/07/14   Loren Racer, MD  ibuprofen (ADVIL,MOTRIN) 800 MG tablet Take 1 tablet (800 mg total) by mouth 3 (three) times daily. Patient not taking: Reported on 12/07/2014 07/20/13   Fayrene Helper, PA-C   Dg Shoulder Right  02/25/2015   CLINICAL DATA:  Right shoulder dislocation  EXAM: RIGHT SHOULDER - 2+ VIEW  COMPARISON:  02/17/2015  FINDINGS: Two views of the right shoulder submitted. There is recurrent anterior inferior right shoulder subluxation. No fracture is noted.  IMPRESSION: Recurrent anterior inferior right shoulder subluxation. No acute  fracture.   Electronically Signed   By: Natasha Mead M.D.   On: 02/25/2015 09:23    Positive ROS: All other systems have been reviewed and were otherwise negative with the exception of those mentioned in the HPI and as above.  Physical Exam: General: Alert, no acute distress Cardiovascular: No pedal edema Respiratory: No cyanosis, no use of accessory musculature GI: No organomegaly, abdomen is soft and non-tender Skin: No lesions in the area of chief complaint Neurologic: Sensation intact distally Psychiatric: Patient is competent for consent with normal mood and affect Lymphatic: No axillary or cervical lymphadenopathy  MUSCULOSKELETAL: R shoulder deformity. Pain with attempted motion. NVI.  Assessment: RIGHT glenohumeral dislocation, recurrent.  Plan: Closed reduction in the ED. NV exam unchanged. Xrays showed successful reduction. Wear immobilizer at all times. F/u in the office with Dr. Ranell Patrick, [place], or [place] in 7-10 days, call to schedule.    [name], Cloyde Reams, MD Cell [phone]    02/25/2015 12:00 PM

## 2015-03-14 ENCOUNTER — Encounter (HOSPITAL_BASED_OUTPATIENT_CLINIC_OR_DEPARTMENT_OTHER): Payer: Self-pay | Admitting: *Deleted

## 2015-03-14 DIAGNOSIS — T07XXXA Unspecified multiple injuries, initial encounter: Secondary | ICD-10-CM

## 2015-03-14 HISTORY — DX: Unspecified multiple injuries, initial encounter: T07.XXXA

## 2015-03-18 ENCOUNTER — Other Ambulatory Visit: Payer: Self-pay | Admitting: Physician Assistant

## 2015-03-18 NOTE — H&P (Signed)
Warren Holland is a new patient to the office.  Presents for evaluation and treatment recommendation for his right shoulder.  Initial injury playing football in high school.  Anteroinferior shoulder dislocation on the right.  Closed reduction.  From that time until now he has had persistent numerous episodes of instability and subluxation.  Unfortunately recently he had a true complete dislocation requiring a trip to the emergency room and closed reduction.  That occurred on February 25, 2015.  I have looked at notes, as well as the films, confirming anteroinferior dislocation with Hill Sachs lesion.  Acceptable reduction.  He comes in to discuss definitive treatment.  He realizes this has gotten to a point of intolerable instability and he would like to get this fixed.  He was actually scheduled to be seen in our office for persistent instability in both 2014 and 2015.  He did not come in for those based on the fact that he did not feel he was symptomatic enough to come in to discuss definitive operative treatment.  He has definitely reached that point now.  He is currently working HVAC work, both residential and commercial.  His history, outside films and reports were reviewed.  I have reviewed previous office notes.  He is otherwise in great health.    EXAMINATION: General exam is outlined and included in the chart.  Specifically, healthy appearing 19 year-old male.  Height: 5?9.  Weight: 165 pounds. Neurovascularly intact upper and lower extremities.  He is so unstable he is wearing a sling at all times on his right shoulder.  Even with the slightest motion he has marked instability.  His cuff is irritated, but I think it is intact.  No apprehension or instability with the left shoulder.    DISPOSITION:  Spent more than 25 minutes face-to-face with Warren Holland and family, who are with him.  The need for treatment is straightforward.  He completely understands and agrees.  We have discussed an arthrogram MRI to  delineate pathology and to make sure that there is nothing else unexpected.  He is so unstable I definitely want to look at his cuff to make sure he doesn't have an associated cuff tear.  I went through the process of an arthroscopic Bankart reconstruction.  Procedure, risks, benefits and complications reviewed.  Anticipated intraoperative and postoperative course reviewed.  The importance of protecting this until it is healed and then doing rehab thoroughly reinforced.  Based on his job where he states there is really not light duty I am anticipating him being out of work for 12 weeks post-op.  Paperwork complete.  He is going to be calling me after  the MR arthrogram is complete just to cover final plans and then I will see him at the time of operative intervention. He current cannot work and I want to get this done as quick as possible so we can get him recovered and rehabbed.    Daniel F. Murphy, M.D.    

## 2015-03-18 NOTE — H&P (Signed)
Warren Holland is a new patient to the office.  Presents for evaluation and treatment recommendation for his right shoulder.  Initial injury playing football in high school.  Anteroinferior shoulder dislocation on the right.  Closed reduction.  From that time until now he has had persistent numerous episodes of instability and subluxation.  Unfortunately recently he had a true complete dislocation requiring a trip to the emergency room and closed reduction.  That occurred on February 25, 2015.  I have looked at notes, as well as the films, confirming anteroinferior dislocation with Warren Holland lesion.  Acceptable reduction.  He comes in to discuss definitive treatment.  He realizes this has gotten to a point of intolerable instability and he would like to get this fixed.  He was actually scheduled to be seen in our office for persistent instability in both 2014 and 2015.  He did not come in for those based on the fact that he did not feel he was symptomatic enough to come in to discuss definitive operative treatment.  He has definitely reached that point now.  He is currently working [employer] work, both residential and commercial.  His history, outside films and reports were reviewed.  I have reviewed previous office notes.  He is otherwise in great health.    EXAMINATION: General exam is outlined and included in the chart.  Specifically, healthy appearing 19 year-old male.  Height: 5?9.  Weight: 165 pounds. Neurovascularly intact upper and lower extremities.  He is so unstable he is wearing a sling at all times on his right shoulder.  Even with the slightest motion he has marked instability.  His cuff is irritated, but I think it is intact.  No apprehension or instability with the left shoulder.    DISPOSITION:  Spent more than 25 minutes face-to-face with Warren Holland and family, who are with him.  The need for treatment is straightforward.  He completely understands and agrees.  We have discussed an arthrogram MRI to  delineate pathology and to make sure that there is nothing else unexpected.  He is so unstable I definitely want to look at his cuff to make sure he doesn't have an associated cuff tear.  I went through the process of an arthroscopic Bankart reconstruction.  Procedure, risks, benefits and complications reviewed.  Anticipated intraoperative and postoperative course reviewed.  The importance of protecting this until it is healed and then doing rehab thoroughly reinforced.  Based on his job where he states there is really not light duty I am anticipating him being out of work for 12 weeks post-op.  Paperwork complete.  He is going to be calling me after  the MR arthrogram is complete just to cover final plans and then I will see him at the time of operative intervention. He current cannot work and I want to get this done as quick as possible so we can get him recovered and rehabbed.    Loreta Ave, M.D.

## 2015-03-21 ENCOUNTER — Ambulatory Visit (HOSPITAL_BASED_OUTPATIENT_CLINIC_OR_DEPARTMENT_OTHER)
Admission: RE | Admit: 2015-03-21 | Discharge: 2015-03-21 | Disposition: A | Discharge status: Home / Self Care | Payer: Medicaid Other | Source: Ambulatory Visit | Attending: Orthopedic Surgery | Admitting: Orthopedic Surgery

## 2015-03-21 ENCOUNTER — Ambulatory Visit (HOSPITAL_BASED_OUTPATIENT_CLINIC_OR_DEPARTMENT_OTHER): Payer: Medicaid Other | Admitting: Anesthesiology

## 2015-03-21 ENCOUNTER — Encounter (HOSPITAL_BASED_OUTPATIENT_CLINIC_OR_DEPARTMENT_OTHER)
Admission: RE | Disposition: A | Discharge status: Home / Self Care | Payer: Self-pay | Source: Ambulatory Visit | Attending: Orthopedic Surgery

## 2015-03-21 ENCOUNTER — Encounter (HOSPITAL_BASED_OUTPATIENT_CLINIC_OR_DEPARTMENT_OTHER): Payer: Self-pay | Admitting: *Deleted

## 2015-03-21 DIAGNOSIS — M25811 Other specified joint disorders, right shoulder: Secondary | ICD-10-CM | POA: Insufficient documentation

## 2015-03-21 DIAGNOSIS — S43431A Superior glenoid labrum lesion of right shoulder, initial encounter: Secondary | ICD-10-CM | POA: Insufficient documentation

## 2015-03-21 DIAGNOSIS — Y92213 High school as the place of occurrence of the external cause: Secondary | ICD-10-CM | POA: Insufficient documentation

## 2015-03-21 DIAGNOSIS — Y9361 Activity, american tackle football: Secondary | ICD-10-CM | POA: Diagnosis not present

## 2015-03-21 DIAGNOSIS — X58XXXA Exposure to other specified factors, initial encounter: Secondary | ICD-10-CM | POA: Diagnosis not present

## 2015-03-21 DIAGNOSIS — M24411 Recurrent dislocation, right shoulder: Secondary | ICD-10-CM | POA: Insufficient documentation

## 2015-03-21 DIAGNOSIS — Y998 Other external cause status: Secondary | ICD-10-CM | POA: Diagnosis not present

## 2015-03-21 HISTORY — PX: SHOULDER ARTHROSCOPY WITH BANKART REPAIR: SHX5673

## 2015-03-21 HISTORY — DX: Other articular cartilage disorders, right shoulder: M24.111

## 2015-03-21 HISTORY — DX: Unspecified multiple injuries, initial encounter: T07.XXXA

## 2015-03-21 LAB — POCT HEMOGLOBIN-HEMACUE: HEMOGLOBIN: 14.6 g/dL (ref 13.0–17.0)

## 2015-03-21 SURGERY — SHOULDER ARTHROSCOPY WITH BANKART REPAIR
Anesthesia: General | Laterality: Right

## 2015-03-21 MED ORDER — HYDROMORPHONE HCL 1 MG/ML IJ SOLN
INTRAMUSCULAR | Status: AC
  Filled 2015-03-21: qty 1

## 2015-03-21 MED ORDER — METHOCARBAMOL 1000 MG/10ML IJ SOLN: 500.0000 mg | Freq: Four times a day (QID) | INTRAVENOUS | Status: DC | PRN

## 2015-03-21 MED ORDER — PROPOFOL 10 MG/ML IV BOLUS
INTRAVENOUS | Status: AC
  Filled 2015-03-21: qty 20

## 2015-03-21 MED ORDER — SCOPOLAMINE 1 MG/3DAYS TD PT72: 1.0000 | MEDICATED_PATCH | Freq: Once | TRANSDERMAL | Status: DC | PRN

## 2015-03-21 MED ORDER — DEXAMETHASONE SODIUM PHOSPHATE 4 MG/ML IJ SOLN
INTRAMUSCULAR | Status: DC | PRN
Start: 2015-03-21 — End: 2015-03-21
  Administered 2015-03-21: 10 mg via INTRAVENOUS

## 2015-03-21 MED ORDER — ONDANSETRON HCL 4 MG PO TABS: 4.0000 mg | ORAL_TABLET | Freq: Four times a day (QID) | ORAL | Status: DC | PRN

## 2015-03-21 MED ORDER — CHLORHEXIDINE GLUCONATE 4 % EX LIQD: 60.0000 mL | Freq: Once | CUTANEOUS | Status: DC

## 2015-03-21 MED ORDER — OXYCODONE-ACETAMINOPHEN 5-325 MG PO TABS: 1.0000 | ORAL_TABLET | ORAL | Status: DC | PRN

## 2015-03-21 MED ORDER — FENTANYL CITRATE (PF) 100 MCG/2ML IJ SOLN
INTRAMUSCULAR | Status: AC
  Filled 2015-03-21: qty 4

## 2015-03-21 MED ORDER — MIDAZOLAM HCL 2 MG/2ML IJ SOLN
INTRAMUSCULAR | Status: AC
  Filled 2015-03-21: qty 4

## 2015-03-21 MED ORDER — METOCLOPRAMIDE HCL 5 MG PO TABS: 5.0000 mg | ORAL_TABLET | Freq: Three times a day (TID) | ORAL | Status: DC | PRN

## 2015-03-21 MED ORDER — PROMETHAZINE HCL 25 MG/ML IJ SOLN: 6.2500 mg | INTRAMUSCULAR | Status: DC | PRN

## 2015-03-21 MED ORDER — MIDAZOLAM HCL 2 MG/2ML IJ SOLN
1.0000 mg | INTRAMUSCULAR | Status: DC | PRN
  Administered 2015-03-21: 2 mg via INTRAVENOUS

## 2015-03-21 MED ORDER — ONDANSETRON HCL 4 MG PO TABS: 4.0000 mg | ORAL_TABLET | Freq: Three times a day (TID) | ORAL | Status: DC | PRN

## 2015-03-21 MED ORDER — KETOROLAC TROMETHAMINE 30 MG/ML IJ SOLN
INTRAMUSCULAR | Status: AC
  Filled 2015-03-21: qty 1

## 2015-03-21 MED ORDER — DEXAMETHASONE SODIUM PHOSPHATE 10 MG/ML IJ SOLN
INTRAMUSCULAR | Status: AC
  Filled 2015-03-21: qty 1

## 2015-03-21 MED ORDER — SUCCINYLCHOLINE CHLORIDE 20 MG/ML IJ SOLN
INTRAMUSCULAR | Status: DC | PRN
  Administered 2015-03-21: 100 mg via INTRAVENOUS

## 2015-03-21 MED ORDER — LIDOCAINE HCL (CARDIAC) 20 MG/ML IV SOLN
INTRAVENOUS | Status: DC | PRN
Start: 2015-03-21 — End: 2015-03-21
  Administered 2015-03-21: 50 mg via INTRAVENOUS

## 2015-03-21 MED ORDER — ONDANSETRON 8 MG PO TBDP
ORAL_TABLET | ORAL | Status: AC
  Filled 2015-03-21: qty 1

## 2015-03-21 MED ORDER — ONDANSETRON 8 MG PO TBDP
8.0000 mg | ORAL_TABLET | Freq: Once | ORAL | Status: AC
  Administered 2015-03-21: 8 mg via ORAL

## 2015-03-21 MED ORDER — MORPHINE SULFATE (PF) 10 MG/ML IV SOLN
INTRAVENOUS | Status: AC
  Filled 2015-03-21: qty 1

## 2015-03-21 MED ORDER — MORPHINE SULFATE 10 MG/ML IJ SOLN
INTRAMUSCULAR | Status: DC | PRN
  Administered 2015-03-21: 2 mg via INTRAVENOUS

## 2015-03-21 MED ORDER — CEFAZOLIN SODIUM-DEXTROSE 2-3 GM-% IV SOLR
INTRAVENOUS | Status: AC
  Filled 2015-03-21: qty 50

## 2015-03-21 MED ORDER — ONDANSETRON HCL 4 MG/2ML IJ SOLN
INTRAMUSCULAR | Status: AC
  Filled 2015-03-21: qty 2

## 2015-03-21 MED ORDER — HYDROMORPHONE HCL 1 MG/ML IJ SOLN: 0.5000 mg | INTRAMUSCULAR | Status: DC | PRN

## 2015-03-21 MED ORDER — HYDROMORPHONE HCL 1 MG/ML IJ SOLN
0.2500 mg | INTRAMUSCULAR | Status: DC | PRN
  Administered 2015-03-21 (×4): 0.5 mg via INTRAVENOUS

## 2015-03-21 MED ORDER — LACTATED RINGERS IV SOLN
INTRAVENOUS | Status: DC
  Administered 2015-03-21: 11:00:00 via INTRAVENOUS

## 2015-03-21 MED ORDER — ONDANSETRON HCL 4 MG/2ML IJ SOLN: 4.0000 mg | Freq: Four times a day (QID) | INTRAMUSCULAR | Status: DC | PRN

## 2015-03-21 MED ORDER — SUCCINYLCHOLINE CHLORIDE 20 MG/ML IJ SOLN
INTRAMUSCULAR | Status: AC
  Filled 2015-03-21: qty 1

## 2015-03-21 MED ORDER — BUPIVACAINE HCL (PF) 0.25 % IJ SOLN
INTRAMUSCULAR | Status: DC | PRN
  Administered 2015-03-21: 20 mL

## 2015-03-21 MED ORDER — LACTATED RINGERS IV SOLN: INTRAVENOUS | Status: DC

## 2015-03-21 MED ORDER — GLYCOPYRROLATE 0.2 MG/ML IJ SOLN: 0.2000 mg | Freq: Once | INTRAMUSCULAR | Status: DC | PRN

## 2015-03-21 MED ORDER — KETOROLAC TROMETHAMINE 30 MG/ML IJ SOLN
INTRAMUSCULAR | Status: DC | PRN
Start: 2015-03-21 — End: 2015-03-21
  Administered 2015-03-21: 30 mg via INTRAVENOUS

## 2015-03-21 MED ORDER — FENTANYL CITRATE (PF) 100 MCG/2ML IJ SOLN
50.0000 ug | INTRAMUSCULAR | Status: DC | PRN
  Administered 2015-03-21: 100 ug via INTRAVENOUS

## 2015-03-21 MED ORDER — CEFAZOLIN SODIUM-DEXTROSE 2-3 GM-% IV SOLR: 2.0000 g | INTRAVENOUS | Status: DC

## 2015-03-21 MED ORDER — METHOCARBAMOL 500 MG PO TABS: 500.0000 mg | ORAL_TABLET | Freq: Four times a day (QID) | ORAL | Status: DC | PRN

## 2015-03-21 MED ORDER — PROPOFOL 10 MG/ML IV BOLUS
INTRAVENOUS | Status: DC | PRN
  Administered 2015-03-21: 250 mg via INTRAVENOUS

## 2015-03-21 MED ORDER — ONDANSETRON HCL 4 MG/2ML IJ SOLN
INTRAMUSCULAR | Status: DC | PRN
  Administered 2015-03-21: 4 mg via INTRAVENOUS

## 2015-03-21 MED ORDER — METOCLOPRAMIDE HCL 5 MG/ML IJ SOLN: 5.0000 mg | Freq: Three times a day (TID) | INTRAMUSCULAR | Status: DC | PRN

## 2015-03-21 SURGICAL SUPPLY — 74 items
ANCHOR SUT BIOCOMP LK 2.9X12.5 (Anchor) ×6 IMPLANT
BENZOIN TINCTURE PRP APPL 2/3 (GAUZE/BANDAGES/DRESSINGS) IMPLANT
BLADE CUTTER GATOR 3.5 (BLADE) ×2 IMPLANT
BLADE CUTTER MENIS 5.5 (BLADE) IMPLANT
BLADE GREAT WHITE 4.2 (BLADE) ×2 IMPLANT
BLADE MINI RND TIP GREEN BEAV (BLADE) IMPLANT
BLADE SURG 15 STRL LF DISP TIS (BLADE) IMPLANT
BLADE SURG 15 STRL SS (BLADE)
BUR OVAL 6.0 (BURR) ×2 IMPLANT
CANNULA 5.75X71 LONG (CANNULA) IMPLANT
CANNULA DRY DOC 8X75 (CANNULA) IMPLANT
CANNULA TWIST IN 8.25X7CM (CANNULA) IMPLANT
CANNULA TWIST IN 8.25X9CM (CANNULA) IMPLANT
DECANTER SPIKE VIAL GLASS SM (MISCELLANEOUS) IMPLANT
DRAPE STERI 35X30 U-POUCH (DRAPES) ×2 IMPLANT
DRAPE U-SHAPE 47X51 STRL (DRAPES) ×2 IMPLANT
DRAPE U-SHAPE 76X120 STRL (DRAPES) ×4 IMPLANT
DRSG PAD ABDOMINAL 8X10 ST (GAUZE/BANDAGES/DRESSINGS) ×2 IMPLANT
DURAPREP 26ML APPLICATOR (WOUND CARE) ×2 IMPLANT
ELECT MENISCUS 165MM 90D (ELECTRODE) ×2 IMPLANT
ELECT REM PT RETURN 9FT ADLT (ELECTROSURGICAL) ×2
ELECTRODE REM PT RTRN 9FT ADLT (ELECTROSURGICAL) ×1 IMPLANT
GAUZE SPONGE 4X4 12PLY STRL (GAUZE/BANDAGES/DRESSINGS) ×2 IMPLANT
GAUZE SPONGE 4X4 16PLY XRAY LF (GAUZE/BANDAGES/DRESSINGS) IMPLANT
GAUZE XEROFORM 1X8 LF (GAUZE/BANDAGES/DRESSINGS) ×2 IMPLANT
GLOVE BIOGEL PI IND STRL 7.0 (GLOVE) ×2 IMPLANT
GLOVE BIOGEL PI INDICATOR 7.0 (GLOVE) ×2
GLOVE ECLIPSE 7.0 STRL STRAW (GLOVE) ×2 IMPLANT
GLOVE SURG ORTHO 8.0 STRL STRW (GLOVE) ×2 IMPLANT
GLOVE SURG SS PI 7.0 STRL IVOR (GLOVE) ×4 IMPLANT
GOWN STRL REUS W/ TWL LRG LVL3 (GOWN DISPOSABLE) ×2 IMPLANT
GOWN STRL REUS W/ TWL XL LVL3 (GOWN DISPOSABLE) ×1 IMPLANT
GOWN STRL REUS W/TWL LRG LVL3 (GOWN DISPOSABLE) ×2
GOWN STRL REUS W/TWL XL LVL3 (GOWN DISPOSABLE) ×1
IMMOBILIZER SHOULDER FOAM XLGE (SOFTGOODS) IMPLANT
KIT PUSHLOCK 2.9 HIP (KITS) ×2 IMPLANT
LASSO 90 CVE QUICKPAS (DISPOSABLE) ×2 IMPLANT
LOOP 2 FIBERLINK CLOSED (SUTURE) IMPLANT
MANIFOLD NEPTUNE II (INSTRUMENTS) ×2 IMPLANT
NDL SUT 6 .5 CRC .975X.05 MAYO (NEEDLE) IMPLANT
NEEDLE MAYO TAPER (NEEDLE)
NS IRRIG 1000ML POUR BTL (IV SOLUTION) IMPLANT
PACK ARTHROSCOPY DSU (CUSTOM PROCEDURE TRAY) ×2 IMPLANT
PACK BASIN DAY SURGERY FS (CUSTOM PROCEDURE TRAY) ×2 IMPLANT
PENCIL BUTTON HOLSTER BLD 10FT (ELECTRODE) ×2 IMPLANT
SET ARTHROSCOPY TUBING (MISCELLANEOUS) ×1
SET ARTHROSCOPY TUBING LN (MISCELLANEOUS) ×1 IMPLANT
SLEEVE SCD COMPRESS KNEE MED (MISCELLANEOUS) IMPLANT
SLING ARM IMMOBILIZER LRG (SOFTGOODS) IMPLANT
SLING ARM IMMOBILIZER MED (SOFTGOODS) IMPLANT
SLING ARM LRG ADULT FOAM STRAP (SOFTGOODS) IMPLANT
SLING ARM MED ADULT FOAM STRAP (SOFTGOODS) IMPLANT
SLING ARM XL FOAM STRAP (SOFTGOODS) IMPLANT
SPONGE LAP 4X18 X RAY DECT (DISPOSABLE) IMPLANT
STRIP CLOSURE SKIN 1/2X4 (GAUZE/BANDAGES/DRESSINGS) IMPLANT
SUCTION FRAZIER TIP 10 FR DISP (SUCTIONS) IMPLANT
SUT ETHIBOND 2 OS 4 DA (SUTURE) IMPLANT
SUT ETHILON 2 0 FS 18 (SUTURE) IMPLANT
SUT ETHILON 3 0 PS 1 (SUTURE) IMPLANT
SUT FIBERWIRE #2 38 T-5 BLUE (SUTURE)
SUT RETRIEVER MED (INSTRUMENTS) IMPLANT
SUT VIC AB 0 CT1 27 (SUTURE)
SUT VIC AB 0 CT1 27XBRD ANBCTR (SUTURE) IMPLANT
SUT VIC AB 2-0 SH 27 (SUTURE)
SUT VIC AB 2-0 SH 27XBRD (SUTURE) IMPLANT
SUT VIC AB 3-0 FS2 27 (SUTURE) IMPLANT
SUTURE FIBERWR #2 38 T-5 BLUE (SUTURE) IMPLANT
SYR BULB 3OZ (MISCELLANEOUS) IMPLANT
TAPE LABRALWHITE 1.5X36 (TAPE) IMPLANT
TAPE SUT LABRALTAP WHT/BLK (SUTURE) IMPLANT
TOWEL OR 17X24 6PK STRL BLUE (TOWEL DISPOSABLE) ×2 IMPLANT
TUBE CONNECTING 20X1/4 (TUBING) IMPLANT
WATER STERILE IRR 1000ML POUR (IV SOLUTION) ×2 IMPLANT
YANKAUER SUCT BULB TIP NO VENT (SUCTIONS) IMPLANT

## 2015-03-21 NOTE — Transfer of Care (Signed)
Immediate Anesthesia Transfer of Care Note  Patient: Warren Holland  Procedure(s) Performed: Procedure(s): RIGHT SHOULDER ARTHROSCOPY WITH BANKART REPAIR (Right)  Patient Location: PACU  Anesthesia Type:General  Level of Consciousness: awake and sedated  Airway & Oxygen Therapy: Patient Spontanous Breathing and Patient connected to face mask oxygen  Post-op Assessment: Report given to RN and Post -op Vital signs reviewed and stable  Post vital signs: Reviewed and stable  Last Vitals:  Filed Vitals:   03/21/15 1113  BP: 125/67  Pulse: 82  Temp: 36.8 C  Resp: 20    Complications: No apparent anesthesia complications

## 2015-03-21 NOTE — Anesthesia Procedure Notes (Signed)
Procedure Name: Intubation Performed by: Chevis Weisensel W Pre-anesthesia Checklist: Patient identified, Timeout performed, Emergency Drugs available, Suction available and Patient being monitored Patient Re-evaluated:Patient Re-evaluated prior to inductionOxygen Delivery Method: Circle system utilized Preoxygenation: Pre-oxygenation with 100% oxygen Intubation Type: IV induction Ventilation: Mask ventilation without difficulty Laryngoscope Size: Miller and 2 Grade View: Grade I Tube type: Oral Tube size: 7.0 mm Number of attempts: 1 Airway Equipment and Method: Stylet Placement Confirmation: ETT inserted through vocal cords under direct vision,  positive ETCO2 and breath sounds checked- equal and bilateral Secured at: 22 cm Tube secured with: Tape Dental Injury: Teeth and Oropharynx as per pre-operative assessment      

## 2015-03-21 NOTE — Interval H&P Note (Signed)
History and Physical Interval Note:  03/21/2015 7:31 AM  Warren Holland  has presented today for surgery, with the diagnosis of DISLOCATION OF UNSPECIFED SHOULDER JOINT,OTHER ARTICULAR CARTILAGE DISORDERS, RIGHT SHOULDER  The various methods of treatment have been discussed with the patient and family. After consideration of risks, benefits and other options for treatment, the patient has consented to  Procedure(s): RIGHT SHOULDER ARTHROSCOPY WITH BANKART REPAIR (Right) as a surgical intervention .  The patient's history has been reviewed, patient examined, no change in status, stable for surgery.  I have reviewed the patient's chart and labs.  Questions were answered to the patient's satisfaction.     Loreta Ave

## 2015-03-21 NOTE — Anesthesia Postprocedure Evaluation (Signed)
  Anesthesia Post-op Note  Patient: Warren Holland  Procedure(s) Performed: Procedure(s): RIGHT SHOULDER ARTHROSCOPY , DEBRIDEMENT LABRUM TEAR, BANKART REPAIR (Right)  Patient Location: PACU  Anesthesia Type:General  Level of Consciousness: awake  Airway and Oxygen Therapy: Patient Spontanous Breathing  Post-op Pain: mild  Post-op Assessment: Post-op Vital signs reviewed              Post-op Vital Signs: Reviewed  Last Vitals:  Filed Vitals:   03/21/15 1330  BP: 130/71  Pulse: 65  Temp:   Resp: 23    Complications: No apparent anesthesia complications

## 2015-03-21 NOTE — H&P (View-Only) (Signed)
Warren Holland is a new patient to the office.  Presents for evaluation and treatment recommendation for his right shoulder.  Initial injury playing football in high school.  Anteroinferior shoulder dislocation on the right.  Closed reduction.  From that time until now he has had persistent numerous episodes of instability and subluxation.  Unfortunately recently he had a true complete dislocation requiring a trip to the emergency room and closed reduction.  That occurred on February 25, 2015.  I have looked at notes, as well as the films, confirming anteroinferior dislocation with Hill Sachs lesion.  Acceptable reduction.  He comes in to discuss definitive treatment.  He realizes this has gotten to a point of intolerable instability and he would like to get this fixed.  He was actually scheduled to be seen in our office for persistent instability in both 2014 and 2015.  He did not come in for those based on the fact that he did not feel he was symptomatic enough to come in to discuss definitive operative treatment.  He has definitely reached that point now.  He is currently working HVAC work, both residential and commercial.  His history, outside films and reports were reviewed.  I have reviewed previous office notes.  He is otherwise in great health.    EXAMINATION: General exam is outlined and included in the chart.  Specifically, healthy appearing 19 year-old male.  Height: 5?9.  Weight: 165 pounds. Neurovascularly intact upper and lower extremities.  He is so unstable he is wearing a sling at all times on his right shoulder.  Even with the slightest motion he has marked instability.  His cuff is irritated, but I think it is intact.  No apprehension or instability with the left shoulder.    DISPOSITION:  Spent more than 25 minutes face-to-face with Warren Holland and family, who are with him.  The need for treatment is straightforward.  He completely understands and agrees.  We have discussed an arthrogram MRI to  delineate pathology and to make sure that there is nothing else unexpected.  He is so unstable I definitely want to look at his cuff to make sure he doesn't have an associated cuff tear.  I went through the process of an arthroscopic Bankart reconstruction.  Procedure, risks, benefits and complications reviewed.  Anticipated intraoperative and postoperative course reviewed.  The importance of protecting this until it is healed and then doing rehab thoroughly reinforced.  Based on his job where he states there is really not light duty I am anticipating him being out of work for 12 weeks post-op.  Paperwork complete.  He is going to be calling me after  the MR arthrogram is complete just to cover final plans and then I will see him at the time of operative intervention. He current cannot work and I want to get this done as quick as possible so we can get him recovered and rehabbed.    Daniel F. Murphy, M.D.    

## 2015-03-21 NOTE — Discharge Instructions (Signed)
Shouder arthroscopy and Labral Repair (Bankart and/or SLAP) Care After Instructions Refer to this sheet in the next few weeks. These discharge instructions provide you with general information on caring for yourself after you leave the hospital. Your caregiver may also give you specific instructions. Your treatment has been planned according to the most current medical practices available, but unavoidable complications sometimes occur. If you have any problems or questions after discharge, please call your caregiver. HOME INSTRUCTIONS You may resume a normal diet and activities as directed.  You will start physical therapy 4 weeks after surgery Take showers instead of baths until informed otherwise.  Change bandages (dressings) in 3 days.  Swab wounds daily with betadine.  Wash shoulder with soap and water.  Pat dry.  Cover wounds with bandaids. Only take over-the-counter or prescription medicines for pain, discomfort, or fever as directed by your caregiver. DO NOT TAKE ANY ANTI-INFLAMMATORIES.  (NO ADVIL, IBUPROFEN, ALEVE, NAPROSYN) Wear your sling for the next 6 weeks unless otherwise instructed. Eat a well-balanced diet.  Avoid lifting or driving until you are instructed otherwise.  Make an appointment to see your caregiver for stitches (suture) or staple removal as directed.   SEEK MEDICAL CARE IF: You have swelling of your calf or leg.  You develop shortness of breath or chest pain.  You have redness, swelling, or increasing pain in the wound.  There is pus or any unusual drainage coming from the surgical site.  You notice a bad smell coming from the surgical site or dressing.  The surgical site breaks open after sutures or staples have been removed.  There is persistent bleeding from the suture or staple line.  You are getting worse or are not improving.  You have any other questions or concerns.  SEEK IMMEDIATE MEDICAL CARE IF:  You have a fever greater than 101 You develop a rash.    You have difficulty breathing.  You develop any reaction or side effects to medicines given.  Your knee motion is decreasing rather than improving.  MAKE SURE YOU:  Understand these instructions.  Will watch your condition.  Will get help right away if you are not doing well or get worse.    Post Anesthesia Home Care Instructions  Activity: Get plenty of rest for the remainder of the day. A responsible adult should stay with you for 24 hours following the procedure.  For the next 24 hours, DO NOT: -Drive a car -Advertising copywriterperate machinery -Drink alcoholic beverages -Take any medication unless instructed by your physician -Make any legal decisions or sign important papers.  Meals: Start with liquid foods such as gelatin or soup. Progress to regular foods as tolerated. Avoid greasy, spicy, heavy foods. If nausea and/or vomiting occur, drink only clear liquids until the nausea and/or vomiting subsides. Call your physician if vomiting continues.  Special Instructions/Symptoms: Your throat may feel dry or sore from the anesthesia or the breathing tube placed in your throat during surgery. If this causes discomfort, gargle with warm salt water. The discomfort should disappear within 24 hours.  If you had a scopolamine patch placed behind your ear for the management of post- operative nausea and/or vomiting:  1. The medication in the patch is effective for 72 hours, after which it should be removed.  Wrap patch in a tissue and discard in the trash. Wash hands thoroughly with soap and water. 2. You may remove the patch earlier than 72 hours if you experience unpleasant side effects which may include dry mouth,  dizziness or visual disturbances. 3. Avoid touching the patch. Wash your hands with soap and water after contact with the patch.

## 2015-03-21 NOTE — Anesthesia Preprocedure Evaluation (Addendum)
Anesthesia Evaluation  Patient identified by MRN, date of birth, ID band Patient awake    Reviewed: Allergy & Precautions, NPO status , Patient's Chart, lab work & pertinent test results  Airway Mallampati: I  TM Distance: >3 FB Neck ROM: Full    Dental   Pulmonary neg pulmonary ROS,    breath sounds clear to auscultation       Cardiovascular negative cardio ROS   Rhythm:Regular Rate:Normal     Neuro/Psych    GI/Hepatic negative GI ROS, Neg liver ROS,   Endo/Other  negative endocrine ROS  Renal/GU negative Renal ROS     Musculoskeletal   Abdominal   Peds  Hematology   Anesthesia Other Findings   Reproductive/Obstetrics                             Anesthesia Physical Anesthesia Plan  ASA: I  Anesthesia Plan: General   Post-op Pain Management:    Induction: Intravenous  Airway Management Planned: Oral ETT  Additional Equipment:   Intra-op Plan:   Post-operative Plan: Extubation in OR  Informed Consent: I have reviewed the patients History and Physical, chart, labs and discussed the procedure including the risks, benefits and alternatives for the proposed anesthesia with the patient or authorized representative who has indicated his/her understanding and acceptance.   Dental advisory given  Plan Discussed with: CRNA and Anesthesiologist  Anesthesia Plan Comments:         Anesthesia Quick Evaluation  

## 2015-03-22 ENCOUNTER — Encounter (HOSPITAL_BASED_OUTPATIENT_CLINIC_OR_DEPARTMENT_OTHER): Payer: Self-pay | Admitting: Orthopedic Surgery

## 2015-03-22 NOTE — Op Note (Signed)
NAMEIDO, WOLLMAN NO.:  [id number]  MEDICAL RECORD NO.:  [id number]  LOCATION:                               FACILITY:  [facility]  PHYSICIAN:  Loreta Ave, M.D. 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  PROCEDURE:  Left shoulder was examined under anesthesia, arthroscopy. Debridement of Hill-Sachs lesion and labrum.  Arthroscopic-assisted Bankart reconstruction, LabralTape x3, PushLock anchors x3 with capsulolabral advancement.  SURGEON:  Loreta Ave, M.D.  ASSISTANT:  Mikey Kirschner, P.A., present throughout the entire case and necessary for timely completion of the procedure.  ANESTHESIA:  General.  BLOOD LOSS:  Minimal.  SPECIMENS:  None.  CULTURES:  None.  COMPLICATIONS:  None.  DRESSINGS:  Soft compressive with shoulder immobilizer.  DESCRIPTION OF PROCEDURE:  The patient was brought to the operating room and placed on the operating table in supine position.  After adequate anesthesia had been obtained, shoulder was examined.  Anteroinferior instability confirmed.  Placed in beach-chair position and shoulder positioner was prepped and draped in usual sterile fashion.  Two portals, anterior and posterior.  Arthroscope was introduced.  Shoulder was distended and inspected.  Chondral debris, labral tear was debrided. Hill-Sachs lesion was debrided.  Capsulolabral interface was then mobilized.  Cannula was placed anteriorly.  I then placed three LabralTape sutures  utilizing the Captain hook device at the 2, 4 and 6 o'clock position.  These were then advanced up on the glenoid and anchored into the glenoid with predrilled PushLock anchors at the 1, 3 and 5 o'clock position.  Nice, firm reconstruction confirmed, looking from the front and back.  Fortunately, not a lot of bony deficiency on the glenoid.  Instruments and fluids were removed.  Shoulder was injected with Marcaine.  Portals were closed with nylon.  Sterile compressive dressing was applied.  Anesthesia was reversed.  Brought to the recovery room.  Tolerated the surgery well.  No complications.     Loreta Ave, M.D.     DFM/MEDQ  D:  03/21/2015  T:  03/21/2015  Job:  [id number]

## 2016-01-30 ENCOUNTER — Encounter (HOSPITAL_COMMUNITY): Payer: Self-pay | Admitting: Emergency Medicine

## 2016-01-30 ENCOUNTER — Ambulatory Visit (HOSPITAL_COMMUNITY)
Admission: EM | Admit: 2016-01-30 | Discharge: 2016-01-30 | Disposition: A | Discharge status: Home / Self Care | Payer: Medicaid Other | Attending: Family Medicine | Admitting: Family Medicine

## 2016-01-30 DIAGNOSIS — Z202 Contact with and (suspected) exposure to infections with a predominantly sexual mode of transmission: Secondary | ICD-10-CM | POA: Insufficient documentation

## 2016-01-30 DIAGNOSIS — Z711 Person with feared health complaint in whom no diagnosis is made: Secondary | ICD-10-CM

## 2016-01-30 NOTE — ED Provider Notes (Addendum)
CSN: [id number]     Arrival date & time 01/30/16  1804 History   First MD Initiated Contact with Patient 01/30/16 1918     Chief Complaint  Patient presents with  . SEXUALLY TRANSMITTED DISEASE   (Consider location/radiation/quality/duration/timing/severity/associated sxs/prior Treatment) Patient is a 20 y.o. male presenting with STD exposure. The history is provided by the patient.  Exposure to STD This is a new problem. The current episode started 6 to 12 hours ago (states girlfriend has chlamydia, pt with no sx.). The problem has not changed since onset.Associated symptoms comments: No sx..    Past Medical History  Diagnosis Date  . 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 Abrasions of multiple sites 03/14/2015    bilateral hand   Past Surgical History  Procedure Laterality Date  . No past surgeries    . Shoulder arthroscopy with bankart repair Right 03/21/2015    Procedure: RIGHT SHOULDER ARTHROSCOPY , DEBRIDEMENT LABRUM TEAR, BANKART REPAIR;  Surgeon: Loreta Aveaniel F Murphy, MD;  Location: [facility];  Service: Orthopedics;  Laterality: Right;   No family history on file. Social History  Substance Use Topics  . Smoking status: Never Smoker   . Smokeless tobacco: Never Used  . Alcohol Use: No    Review of Systems  Constitutional: Negative.   Gastrointestinal: Negative.   Genitourinary: Negative.  Negative for discharge.  All other systems reviewed and are negative.   Allergies  Review of patient's allergies indicates no known allergies.  Home Medications   Prior to Admission medications   Medication Sig Start Date End Date Taking? Authorizing Provider  ondansetron (ZOFRAN) 4 MG tablet Take 1 tablet (4 mg total) by mouth every 8 (eight) hours as needed for nausea or vomiting. 03/21/15   Cristie HemMary L Stanbery, PA-C  oxyCODONE-acetaminophen (ROXICET) 5-325 MG per tablet Take 1-2 tablets by mouth every 4  (four) hours as needed. 03/21/15   Cristie HemMary L Stanbery, PA-C   Meds Ordered and Administered this Visit  Medications - No data to display  BP 134/57 mmHg  Pulse 55  Temp(Src) 98.7 F (37.1 C) (Oral)  Resp 16  SpO2 100% No data found.   Physical Exam  Constitutional: He is oriented to person, place, and time. He appears well-developed and well-nourished. No distress.  Abdominal: Soft. Bowel sounds are normal.  Genitourinary: Penis normal.  Neurological: He is alert and oriented to person, place, and time.  Skin: Skin is warm.  Nursing note and vitals reviewed.   ED Course  Procedures (including critical care time)  Labs Review Labs Reviewed  URINE CYTOLOGY ANCILLARY ONLY    Imaging Review No results found.   Visual Acuity Review  Right Eye Distance:   Left Eye Distance:   Bilateral Distance:    Right Eye Near:   Left Eye Near:    Bilateral Near:         MDM   1. Concern about STD in male without diagnosis        Linna HoffJames D Terryl Niziolek, MD 01/30/16 1950  Linna HoffJames D Avontae Burkhead, MD 01/30/16 2114

## 2016-01-30 NOTE — ED Notes (Signed)
PT wishes to be tested for STDs and specifically wants to be tested for chlymydia. PT denies known exposure or symptoms.

## 2016-01-30 NOTE — Discharge Instructions (Signed)
We will call with positive test results and treat as indicated  °

## 2016-01-31 ENCOUNTER — Encounter (HOSPITAL_COMMUNITY): Payer: Self-pay

## 2016-01-31 ENCOUNTER — Emergency Department (HOSPITAL_COMMUNITY): Payer: Medicaid Other

## 2016-01-31 ENCOUNTER — Emergency Department (HOSPITAL_COMMUNITY)
Admission: EM | Admit: 2016-01-31 | Discharge: 2016-01-31 | Disposition: A | Discharge status: Home / Self Care | Payer: Medicaid Other | Attending: Emergency Medicine | Admitting: Emergency Medicine

## 2016-01-31 DIAGNOSIS — W25XXXA Contact with sharp glass, initial encounter: Secondary | ICD-10-CM | POA: Insufficient documentation

## 2016-01-31 DIAGNOSIS — Y92009 Unspecified place in unspecified non-institutional (private) residence as the place of occurrence of the external cause: Secondary | ICD-10-CM | POA: Diagnosis not present

## 2016-01-31 DIAGNOSIS — Y9389 Activity, other specified: Secondary | ICD-10-CM | POA: Diagnosis not present

## 2016-01-31 DIAGNOSIS — S61215A Laceration without foreign body of left ring finger without damage to nail, initial encounter: Secondary | ICD-10-CM | POA: Insufficient documentation

## 2016-01-31 DIAGNOSIS — S61219A Laceration without foreign body of unspecified finger without damage to nail, initial encounter: Secondary | ICD-10-CM

## 2016-01-31 DIAGNOSIS — Y990 Civilian activity done for income or pay: Secondary | ICD-10-CM | POA: Diagnosis not present

## 2016-01-31 LAB — URINE CYTOLOGY ANCILLARY ONLY
CHLAMYDIA, DNA PROBE: POSITIVE — AB
NEISSERIA GONORRHEA: NEGATIVE
TRICH (WINDOWPATH): NEGATIVE

## 2016-01-31 MED ORDER — LIDOCAINE HCL (PF) 1 % IJ SOLN
30.0000 mL | Freq: Once | INTRAMUSCULAR | Status: DC
  Filled 2016-01-31: qty 30

## 2016-01-31 MED ORDER — BACITRACIN ZINC 500 UNIT/GM EX OINT
TOPICAL_OINTMENT | CUTANEOUS | Status: DC
Start: 2016-01-31 — End: 2016-01-31
  Filled 2016-01-31: qty 0.9

## 2016-01-31 NOTE — Discharge Instructions (Signed)
Suture removal in 10 days.  Laceration Care, Adult A laceration is a cut that goes through all of the layers of the skin and into the tissue that is right under the skin. Some lacerations heal on their own. Others need to be closed with stitches (sutures), staples, skin adhesive strips, or skin glue. Proper laceration care minimizes the risk of infection and helps the laceration to heal better. HOW TO CARE FOR YOUR LACERATION If sutures or staples were used:  Keep the wound clean and dry.  If you were given a bandage (dressing), you should change it at least one time per day or as told by your health care provider. You should also change it if it becomes wet or dirty.  Keep the wound completely dry for the first 24 hours or as told by your health care provider. After that time, you may shower or bathe. However, make sure that the wound is not soaked in water until after the sutures or staples have been removed.  Clean the wound one time each day or as told by your health care provider:  Wash the wound with soap and water.  Rinse the wound with water to remove all soap.  Pat the wound dry with a clean towel. Do not rub the wound.  After cleaning the wound, apply a thin layer of antibiotic ointmentas told by your health care provider. This will help to prevent infection and keep the dressing from sticking to the wound.  Have the sutures or staples removed as told by your health care provider. If skin adhesive strips were used:  Keep the wound clean and dry.  If you were given a bandage (dressing), you should change it at least one time per day or as told by your health care provider. You should also change it if it becomes dirty or wet.  Do not get the skin adhesive strips wet. You may shower or bathe, but be careful to keep the wound dry.  If the wound gets wet, pat it dry with a clean towel. Do not rub the wound.  Skin adhesive strips fall off on their own. You may trim the  strips as the wound heals. Do not remove skin adhesive strips that are still stuck to the wound. They will fall off in time. If skin glue was used:  Try to keep the wound dry, but you may briefly wet it in the shower or bath. Do not soak the wound in water, such as by swimming.  After you have showered or bathed, gently pat the wound dry with a clean towel. Do not rub the wound.  Do not do any activities that will make you sweat heavily until the skin glue has fallen off on its own.  Do not apply liquid, cream, or ointment medicine to the wound while the skin glue is in place. Using those may loosen the film before the wound has healed.  If you were given a bandage (dressing), you should change it at least one time per day or as told by your health care provider. You should also change it if it becomes dirty or wet.  If a dressing is placed over the wound, be careful not to apply tape directly over the skin glue. Doing that may cause the glue to be pulled off before the wound has healed.  Do not pick at the glue. The skin glue usually remains in place for 5-10 days, then it falls off of the skin. General  Instructions  Take over-the-counter and prescription medicines only as told by your health care provider.  If you were prescribed an antibiotic medicine or ointment, take or apply it as told by your doctor. Do not stop using it even if your condition improves.  To help prevent scarring, make sure to cover your wound with sunscreen whenever you are outside after stitches are removed, after adhesive strips are removed, or when glue remains in place and the wound is healed. Make sure to wear a sunscreen of at least 30 SPF.  Do not scratch or pick at the wound.  Keep all follow-up visits as told by your health care provider. This is important.  Check your wound every day for signs of infection. Watch for:  Redness, swelling, or pain.  Fluid, blood, or pus.  Raise (elevate) the injured  area above the level of your heart while you are sitting or lying down, if possible. SEEK MEDICAL CARE IF:  You received a tetanus shot and you have swelling, severe pain, redness, or bleeding at the injection site.  You have a fever.  A wound that was closed breaks open.  You notice a bad smell coming from your wound or your dressing.  You notice something coming out of the wound, such as wood or glass.  Your pain is not controlled with medicine.  You have increased redness, swelling, or pain at the site of your wound.  You have fluid, blood, or pus coming from your wound.  You notice a change in the color of your skin near your wound.  You need to change the dressing frequently due to fluid, blood, or pus draining from the wound.  You develop a new rash.  You develop numbness around the wound. SEEK IMMEDIATE MEDICAL CARE IF:  You develop severe swelling around the wound.  Your pain suddenly increases and is severe.  You develop painful lumps near the wound or on skin that is anywhere on your body.  You have a red streak going away from your wound.  The wound is on your hand or foot and you cannot properly move a finger or toe.  The wound is on your hand or foot and you notice that your fingers or toes look pale or bluish.   This information is not intended to replace advice given to you by your health care provider. Make sure you discuss any questions you have with your health care provider.   Document Released: 07/06/2005 Document Revised: 11/20/2014 Document Reviewed: 07/02/2014 Elsevier Interactive Patient Education [employer]2016 Elsevier Inc.

## 2016-01-31 NOTE — ED Notes (Signed)
Pt at work removing window glass.  Glass broke.  1/2 laceration to left ring finger.  Bleeding minimal.

## 2016-01-31 NOTE — ED Provider Notes (Signed)
CSN: [id number]     Arrival date & time 01/31/16  [id number] History   First MD Initiated Contact with Patient 01/31/16 1041     Chief Complaint  Patient presents with  . Extremity Laceration     HPI  Patient presents for evaluation of the left hand laceration. He was removing a broken glass from a house window. A piece of glass broke and fell onto his left hand dorsally as well as fingers were outstretched. Minimal bleeding. No numbness.  Past Medical History  Diagnosis Date  . 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 Abrasions of multiple sites 03/14/2015    bilateral hand   Past Surgical History  Procedure Laterality Date  . No past surgeries    . Shoulder arthroscopy with bankart repair Right 03/21/2015    Procedure: RIGHT SHOULDER ARTHROSCOPY , DEBRIDEMENT LABRUM TEAR, BANKART REPAIR;  Surgeon: Loreta Aveaniel F Murphy, MD;  Location: [facility];  Service: Orthopedics;  Laterality: Right;   History reviewed. No pertinent family history. Social History  Substance Use Topics  . Smoking status: Never Smoker   . Smokeless tobacco: Never Used  . Alcohol Use: No    Review of Systems  Constitutional: Negative for fever, chills, diaphoresis, appetite change and fatigue.  HENT: Negative for mouth sores, sore throat and trouble swallowing.   Eyes: Negative for visual disturbance.  Respiratory: Negative for cough, chest tightness, shortness of breath and wheezing.   Cardiovascular: Negative for chest pain.  Gastrointestinal: Negative for nausea, vomiting, abdominal pain, diarrhea and abdominal distention.  Endocrine: Negative for polydipsia, polyphagia and polyuria.  Genitourinary: Negative for dysuria, frequency and hematuria.  Musculoskeletal: Negative for gait problem.  Skin: Positive for wound. Negative for color change, pallor and rash.  Neurological: Negative for dizziness, syncope, light-headedness and  headaches.  Hematological: Does not bruise/bleed easily.  Psychiatric/Behavioral: Negative for behavioral problems and confusion.      Allergies  Review of patient's allergies indicates no known allergies.  Home Medications   Prior to Admission medications   Medication Sig Start Date End Date Taking? Authorizing Provider  ondansetron (ZOFRAN) 4 MG tablet Take 1 tablet (4 mg total) by mouth every 8 (eight) hours as needed for nausea or vomiting. 03/21/15   Cristie HemMary L Stanbery, PA-C  oxyCODONE-acetaminophen (ROXICET) 5-325 MG per tablet Take 1-2 tablets by mouth every 4 (four) hours as needed. 03/21/15   Mary L Stanbery, PA-C   BP 121/70 mmHg  Pulse 81  Temp(Src) 97.7 F (36.5 C) (Oral)  Resp 20  SpO2 100% Physical Exam  Constitutional: He is oriented to person, place, and time. He appears well-developed and well-nourished. No distress.  HENT:  Head: Normocephalic.  Eyes: Conjunctivae are normal. Pupils are equal, round, and reactive to light. No scleral icterus.  Neck: Normal range of motion. Neck supple. No thyromegaly present.  Cardiovascular: Normal rate and regular rhythm.  Exam reveals no gallop and no friction rub.   No murmur heard. Pulmonary/Chest: Effort normal and breath sounds normal. No respiratory distress. He has no wheezes. He has no rales.  Abdominal: Soft. Bowel sounds are normal. He exhibits no distension. There is no tenderness. There is no rebound.  Musculoskeletal: Normal range of motion.       Hands: Neurological: He is alert and oriented to person, place, and time.  Skin: Skin is warm and dry. No rash noted.  Psychiatric: He has a normal mood and affect. His behavior is normal.  ED Course  Procedures (including critical care time) Labs Review Labs Reviewed - No data to display  Imaging Review Dg Hand Complete Left  01/31/2016  CLINICAL DATA:  Laceration with glass. EXAM: LEFT HAND - COMPLETE 3+ VIEW COMPARISON:  None. FINDINGS: There is no evidence of  fracture or dislocation. There is no evidence of arthropathy or other focal bone abnormality. Soft tissues are unremarkable. No radiopaque foreign body. IMPRESSION: Negative. Electronically Signed   By: Charlett Nose M.D.   On: 01/31/2016 10:43   I have personally reviewed and evaluated these images and lab results as part of my medical decision-making.   EKG Interpretation None      MDM   Final diagnoses:  Finger laceration, initial encounter    LACERATION REPAIR Performed by: Claudean Kinds Authorized by: Claudean Kinds Consent: Verbal consent obtained. Risks and benefits: risks, benefits and alternatives were discussed Consent given by: patient Patient identity confirmed: provided demographic data Prepped and Draped in normal sterile fashion Wound explored  Laceration Location: LRF at MCP dorsal  Laceration Length: 1cm  No Foreign Bodies seen or palpated  Anesthesia: local infiltration  Local anesthetic: lidocaine 1% c epinephrine  Anesthetic total: 3 ml  Irrigation method: syringe Amount of cleaning: standard  Skin closure: 4-0 prolene  Number of sutures: 3  Technique: simple, interrupted  Patient tolerance: Patient tolerated the procedure well with no immediate complications.  After anesthesia the wound was examined throughout range of motion showing no extensor tendon laceration.     Rolland Porter, MD 01/31/16 1204

## 2016-02-01 ENCOUNTER — Telehealth: Payer: Self-pay | Admitting: Internal Medicine

## 2016-02-01 MED ORDER — AZITHROMYCIN 500 MG PO TABS: 1000.0000 mg | ORAL_TABLET | Freq: Once | ORAL | Status: DC

## 2016-02-01 NOTE — Telephone Encounter (Signed)
Clinical staff, please let patient and health Department know that test for chlamydia was positive.   Rx zithromax was sent to the pharmacy of record, [facility] at [address]. Sexual partners need to be notified and tested/treated. Tests for gonorrhea and Trichomonas were negative. Recheck as needed if symptoms persist.  LM

## 2016-02-05 ENCOUNTER — Telehealth (HOSPITAL_COMMUNITY): Payer: Self-pay | Admitting: *Deleted

## 2016-02-05 NOTE — Telephone Encounter (Signed)
Call back completed. Patient verbalized understanding of diagnosis and importance of prescription.

## 2020-05-13 ENCOUNTER — Encounter (HOSPITAL_COMMUNITY): Payer: Self-pay | Admitting: Emergency Medicine

## 2020-05-13 ENCOUNTER — Other Ambulatory Visit: Payer: Self-pay

## 2020-05-13 ENCOUNTER — Emergency Department (HOSPITAL_COMMUNITY): Payer: Self-pay

## 2020-05-13 ENCOUNTER — Emergency Department (HOSPITAL_COMMUNITY)
Admission: EM | Admit: 2020-05-13 | Discharge: 2020-05-14 | Disposition: A | Discharge status: Home / Self Care | Payer: Self-pay | Attending: Emergency Medicine | Admitting: Emergency Medicine

## 2020-05-13 DIAGNOSIS — W03XXXA Other fall on same level due to collision with another person, initial encounter: Secondary | ICD-10-CM | POA: Insufficient documentation

## 2020-05-13 DIAGNOSIS — R110 Nausea: Secondary | ICD-10-CM | POA: Insufficient documentation

## 2020-05-13 DIAGNOSIS — S43005A Unspecified dislocation of left shoulder joint, initial encounter: Secondary | ICD-10-CM | POA: Insufficient documentation

## 2020-05-13 MED ORDER — ETOMIDATE 2 MG/ML IV SOLN
10.0000 mg | Freq: Once | INTRAVENOUS | Status: AC
  Administered 2020-05-13: 10 mg via INTRAVENOUS
  Filled 2020-05-13: qty 10

## 2020-05-13 MED ORDER — OXYCODONE-ACETAMINOPHEN 5-325 MG PO TABS
1.0000 | ORAL_TABLET | Freq: Once | ORAL | Status: AC
  Administered 2020-05-13: 1 via ORAL
  Filled 2020-05-13: qty 1

## 2020-05-13 MED ORDER — FENTANYL CITRATE (PF) 100 MCG/2ML IJ SOLN
100.0000 ug | Freq: Once | INTRAMUSCULAR | Status: AC
  Administered 2020-05-13: 100 ug via INTRAVENOUS
  Filled 2020-05-13: qty 2

## 2020-05-13 MED ORDER — FENTANYL CITRATE (PF) 100 MCG/2ML IJ SOLN
50.0000 ug | Freq: Once | INTRAMUSCULAR | Status: DC
  Filled 2020-05-13: qty 2

## 2020-05-13 NOTE — ED Provider Notes (Signed)
[facility]   CSN: [id number] Arrival date & time: 05/13/20  1930     History Chief Complaint  Patient presents with  . Shoulder Injury    Anterior Dislocation     Warren Holland is a 24 y.o. male.  The history is provided by the patient.  Shoulder Injury This is a new problem. The current episode started 3 to 5 hours ago. The problem occurs constantly. The problem has not changed since onset.Pertinent negatives include no abdominal pain, no headaches and no shortness of breath. Exacerbated by: movement. Nothing relieves the symptoms.  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            Denies abdominal pain, headache and neck pain.       Past Medical History:  Diagnosis Date  . 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   Past Surgical History:  Procedure Laterality Date  . NO PAST SURGERIES    . SHOULDER ARTHROSCOPY WITH BANKART REPAIR Right 03/21/2015   Procedure: RIGHT SHOULDER ARTHROSCOPY , DEBRIDEMENT LABRUM TEAR, BANKART REPAIR;  Surgeon: Loreta Aveaniel F Murphy, MD;  Location: [facility];  Service: Orthopedics;  Laterality: Right;       No family history on file.  Social History   Tobacco Use  . Smoking status: Never Smoker  . Smokeless tobacco: Never Used  Substance Use Topics  . Alcohol use: No  . Drug use: Yes    Types: Marijuana    Comment: last used 03/13/2015    Home  Medications Prior to Admission medications   Not on File    Allergies    Patient has no known allergies.  Review of Systems   Review of Systems  Constitutional: Negative for chills and fever.  HENT: Negative for congestion and sore throat.   Eyes: Negative for visual disturbance.  Respiratory: Negative for shortness of breath.   Cardiovascular: Negative for leg swelling.  Gastrointestinal: Positive for nausea. Negative for abdominal pain.  Musculoskeletal: Negative for neck pain.       L Shoulder pain  Skin: Negative for rash.  Neurological: Negative for headaches.       L shoulder tingling  All other systems reviewed and are negative.   Physical Exam Updated Vital Signs BP (!) 143/93 (BP Location: Right Arm)   Pulse 68   Temp 98.3 F (36.8 C) (Oral)   Resp 18   Ht 5\' 8"  (1.727 m)   Wt 82 kg   SpO2 100%   BMI 27.49 kg/m   Physical Exam Vitals reviewed.  Constitutional:      Appearance: Normal appearance. He is not toxic-appearing.  HENT:     Head: Normocephalic and atraumatic.     Nose: Nose normal.     Mouth/Throat:     Mouth: Mucous membranes are moist.     Pharynx: Oropharynx is clear.  Eyes:  Conjunctiva/sclera: Conjunctivae normal.  Cardiovascular:     Pulses: Normal pulses.  Pulmonary:     Breath sounds: Normal breath sounds.  Abdominal:     General: Abdomen is flat.     Palpations: Abdomen is soft.  Musculoskeletal:     Cervical back: Neck supple. No tenderness.     Right lower leg: No edema.     Left lower leg: No edema.     Comments: Deformity of L shoulder  ROM in L shoulder limited 2/2 pain  Skin:    General: Skin is warm and dry.  Neurological:     Mental Status: He is alert.  Psychiatric:        Mood and Affect: Mood normal.        Behavior: Behavior normal.      ED Results / Procedures / Treatments   Labs (all labs ordered are listed, but only abnormal results are displayed) Labs Reviewed  CBC WITH DIFFERENTIAL/PLATELET   BASIC METABOLIC PANEL    EKG None  Radiology DG Shoulder 1 View Left  Result Date: 05/13/2020 CLINICAL DATA:  Dislocation EXAM: LEFT SHOULDER COMPARISON:  None. FINDINGS: There is been interval reduction of the anterior dislocation. No definite fracture seen. There does however appear to be a slight Hill-Sachs deformity at the posterolateral corner of the humeral head. A healed fracture deformity of the distal clavicle is noted. IMPRESSION: Interval reduction of anterior dislocation. Hill-Sachs deformity at the posterolateral corner of the humeral head. Electronically Signed   By: Jonna Clark M.D.   On: 05/13/2020 23:31   DG Shoulder Left  Result Date: 05/13/2020 CLINICAL DATA:  Status post trauma. EXAM: LEFT SHOULDER - 2+ VIEW COMPARISON:  March 27, 2011 FINDINGS: There is anterior dislocation of the left shoulder. No associated acute fracture is seen. A chronic fracture of the distal left clavicle is noted. Soft tissues are unremarkable. IMPRESSION: Anterior dislocation of the left shoulder without associated acute fracture. Electronically Signed   By: Aram Candela M.D.   On: 05/13/2020 20:44    Procedures .Ortho Injury Treatment  Date/Time: 05/13/2020 11:07 PM Performed by: Brantley Fling, MD Authorized by: Marily Memos, MD   Consent:    Consent obtained:  Verbal   Consent given by:  Patient   Risks discussed:  Irreducible dislocation, nerve damage, recurrent dislocation, restricted joint movement and vascular damage   Alternatives discussed:  No treatment and delayed treatmentInjury location: shoulder Location details: left shoulder Injury type: dislocation Dislocation type: anterior Hill-Sachs deformity: yes Chronicity: new Pre-procedure neurovascular assessment: neurovascularly intact Pre-procedure distal perfusion: normal Pre-procedure neurological function: normal Pre-procedure range of motion: reduced  Anesthesia: Local anesthesia used: no  Patient  sedated: Yes. Refer to sedation procedure documentation for details of sedation. Manipulation performed: yes Reduction method: traction and counter traction Reduction successful: yes Immobilization: sling Post-procedure neurovascular assessment: post-procedure neurovascularly intact Post-procedure distal perfusion: normal Post-procedure neurological function: normal Post-procedure range of motion: improved Patient tolerance: patient tolerated the procedure well with no immediate complications  .Sedation  Date/Time: 05/13/2020 11:09 PM Performed by: Brantley Fling, MD Authorized by: Marily Memos, MD   Consent:    Consent obtained:  Verbal   Consent given by:  Patient   Risks discussed:  Allergic reaction, dysrhythmia, inadequate sedation, nausea, prolonged hypoxia resulting in organ damage, prolonged sedation necessitating reversal, vomiting and respiratory compromise necessitating ventilatory assistance and intubation   Alternatives discussed:  Analgesia without sedation and anxiolysis Universal protocol:    Procedure explained and questions answered to patient or  proxy's satisfaction: yes     Imaging studies available: yes     Immediately prior to procedure a time out was called: yes   Indications:    Procedure necessitating sedation performed by:  Physician performing sedation Pre-sedation assessment:    Time since last food or drink:  Unkown> 5 hours   NPO status caution: unable to specify NPO status     ASA classification: class 1 - normal, healthy patient     Neck mobility: normal     Mallampati score:  I - soft palate, uvula, fauces, pillars visible   Pre-sedation assessments completed and reviewed: airway patency, mental status, pain level and respiratory function   Immediate pre-procedure details:    Reassessment: Patient reassessed immediately prior to procedure     Reviewed: vital signs and relevant labs/tests     Verified: bag valve mask available, emergency  equipment available, intubation equipment available, IV patency confirmed, oxygen available and suction available   Procedure details (see MAR for exact dosages):    Preoxygenation:  Nasal cannula   Sedation:  Etomidate   Intended level of sedation: deep   Analgesia:  Fentanyl   Intra-procedure monitoring:  Blood pressure monitoring and cardiac monitor   Total Provider sedation time (minutes):  17 Post-procedure details:    Post-sedation assessment completed:  05/13/2020 11:11 PM   Recovery: Patient returned to pre-procedure baseline     Patient is stable for discharge or admission: yes     Patient tolerance:  Tolerated well, no immediate complications   (including critical care time)  Medications Ordered in ED Medications  oxyCODONE-acetaminophen (PERCOCET/ROXICET) 5-325 MG per tablet 1 tablet (1 tablet Oral Given 05/13/20 2002)  fentaNYL (SUBLIMAZE) injection 100 mcg (100 mcg Intravenous Given 05/13/20 2227)  etomidate (AMIDATE) injection 10 mg (10 mg Intravenous Given 05/13/20 2252)    ED Course  I have reviewed the triage vital signs and the nursing notes.  Pertinent labs & imaging results that were available during my care of the patient were reviewed by me and considered in my medical decision making (see chart for details).    MDM Rules/Calculators/A&P                           MDM Number of Diagnoses or Management Options  Medical Decision Making: Warren Holland is a 24 y.o. male who presented to the ED today with shoulder injury.  Past medical history significant for reports hx of shoulder dislocation many years ago.  Reviewed and confirmed nursing documentation for past medical history, family history, social history.  On my initial exam, the pt was in NAD, neurovascularly intact, L shoulder deformity with anterior displacement.  Xray shows Anterior dislocation of the left shoulder without associated acute fracture  Consults: none performed  All radiology and  laboratory studies reviewed independently and with my attending physician, agree with reading provided by radiologist unless otherwise noted.   Shoulder reduced under procedural sedation as documented above.  Repeat xray shows interval relocation with hill sachs deformity.   Based on the above findings, I believe patient is hemodynamically stable for discharge  Pt advised to call ortho for follow up. Keep arm in sling for comfort.   Patient/and family educated about specific return precautions for given chief complaint and symptoms.  Patient/and family educated about follow-up with PCP.  Patient/and family expressed understanding of return precautions and need for follow-up.  Patient discharged.  The above care was discussed with and agreed  upon by my attending physician. Emergency Department Medication Summary:  Medications  oxyCODONE-acetaminophen (PERCOCET/ROXICET) 5-325 MG per tablet 1 tablet (1 tablet Oral Given 05/13/20 2002)  fentaNYL (SUBLIMAZE) injection 100 mcg (100 mcg Intravenous Given 05/13/20 2227)  etomidate (AMIDATE) injection 10 mg (10 mg Intravenous Given 05/13/20 2252)         Final Clinical Impression(s) / ED Diagnoses Final diagnoses:  Dislocation of left shoulder joint, initial encounter    Rx / DC Orders ED Discharge Orders    None       Brantley Fling, MD 05/14/20 Abagail Kitchens, MD 05/14/20 2259

## 2020-05-13 NOTE — ED Notes (Signed)
Pt declined labs

## 2020-05-13 NOTE — ED Triage Notes (Signed)
Patient injured his left shoulder while playing football this evening with pain and mild swelling .

## 2021-03-30 IMAGING — DX DG SHOULDER 2+V*L*
3 series · 3 of 3 positions shown · non-contrast
Comparison: March 27, 2011

CLINICAL DATA: Status post trauma.

EXAM:
LEFT SHOULDER - 2+ VIEW

[shoulder grashey]
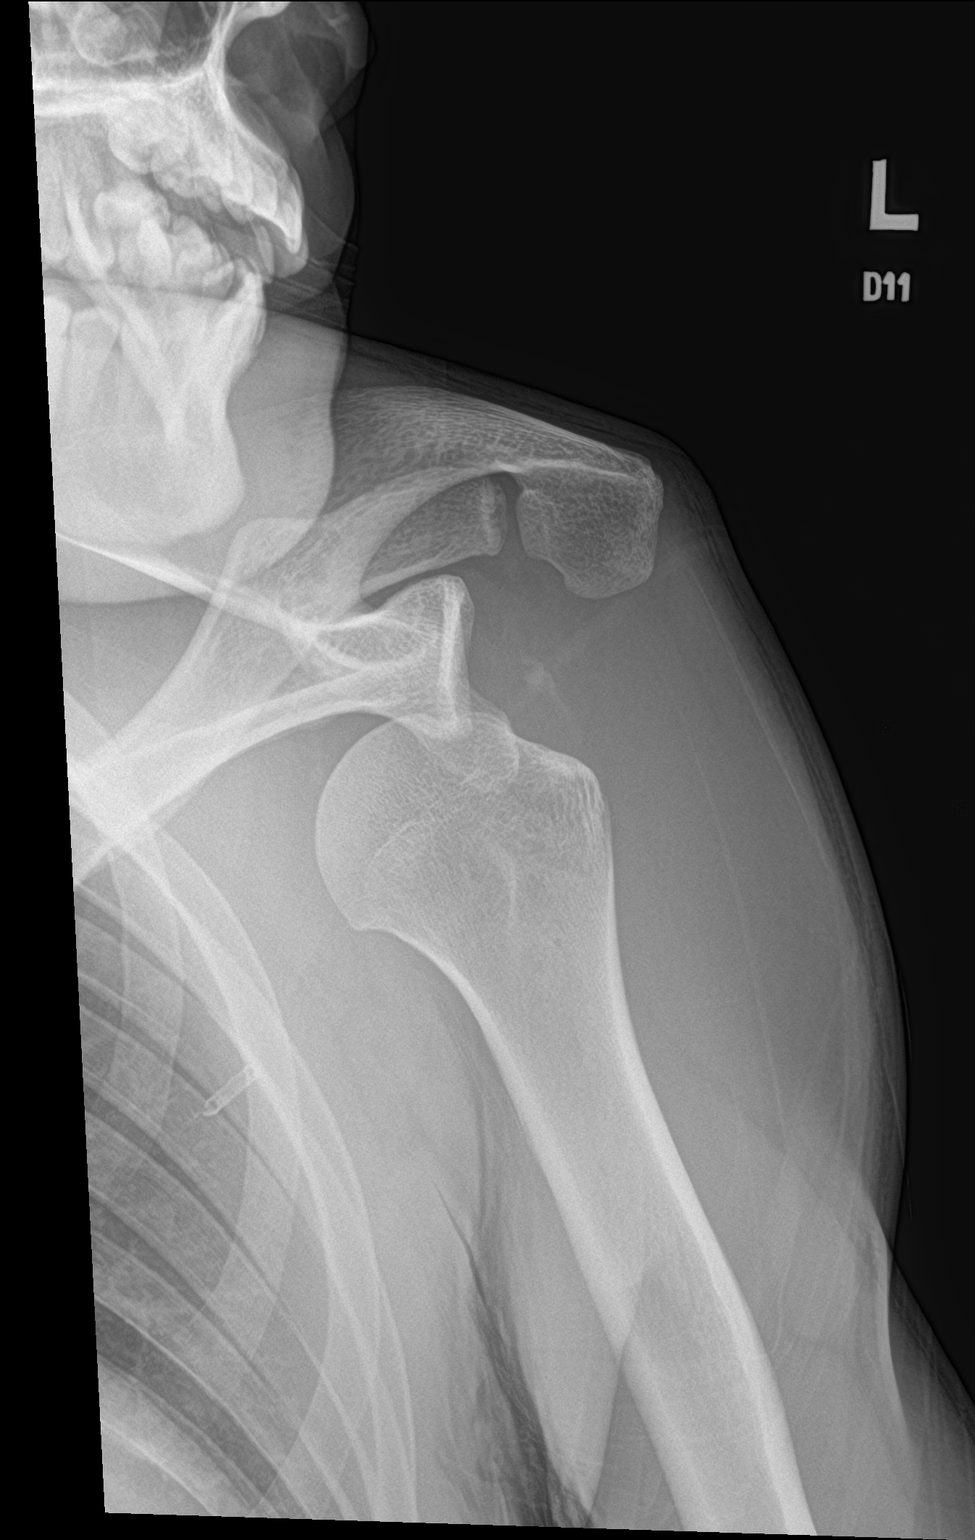

[shoulder y view]
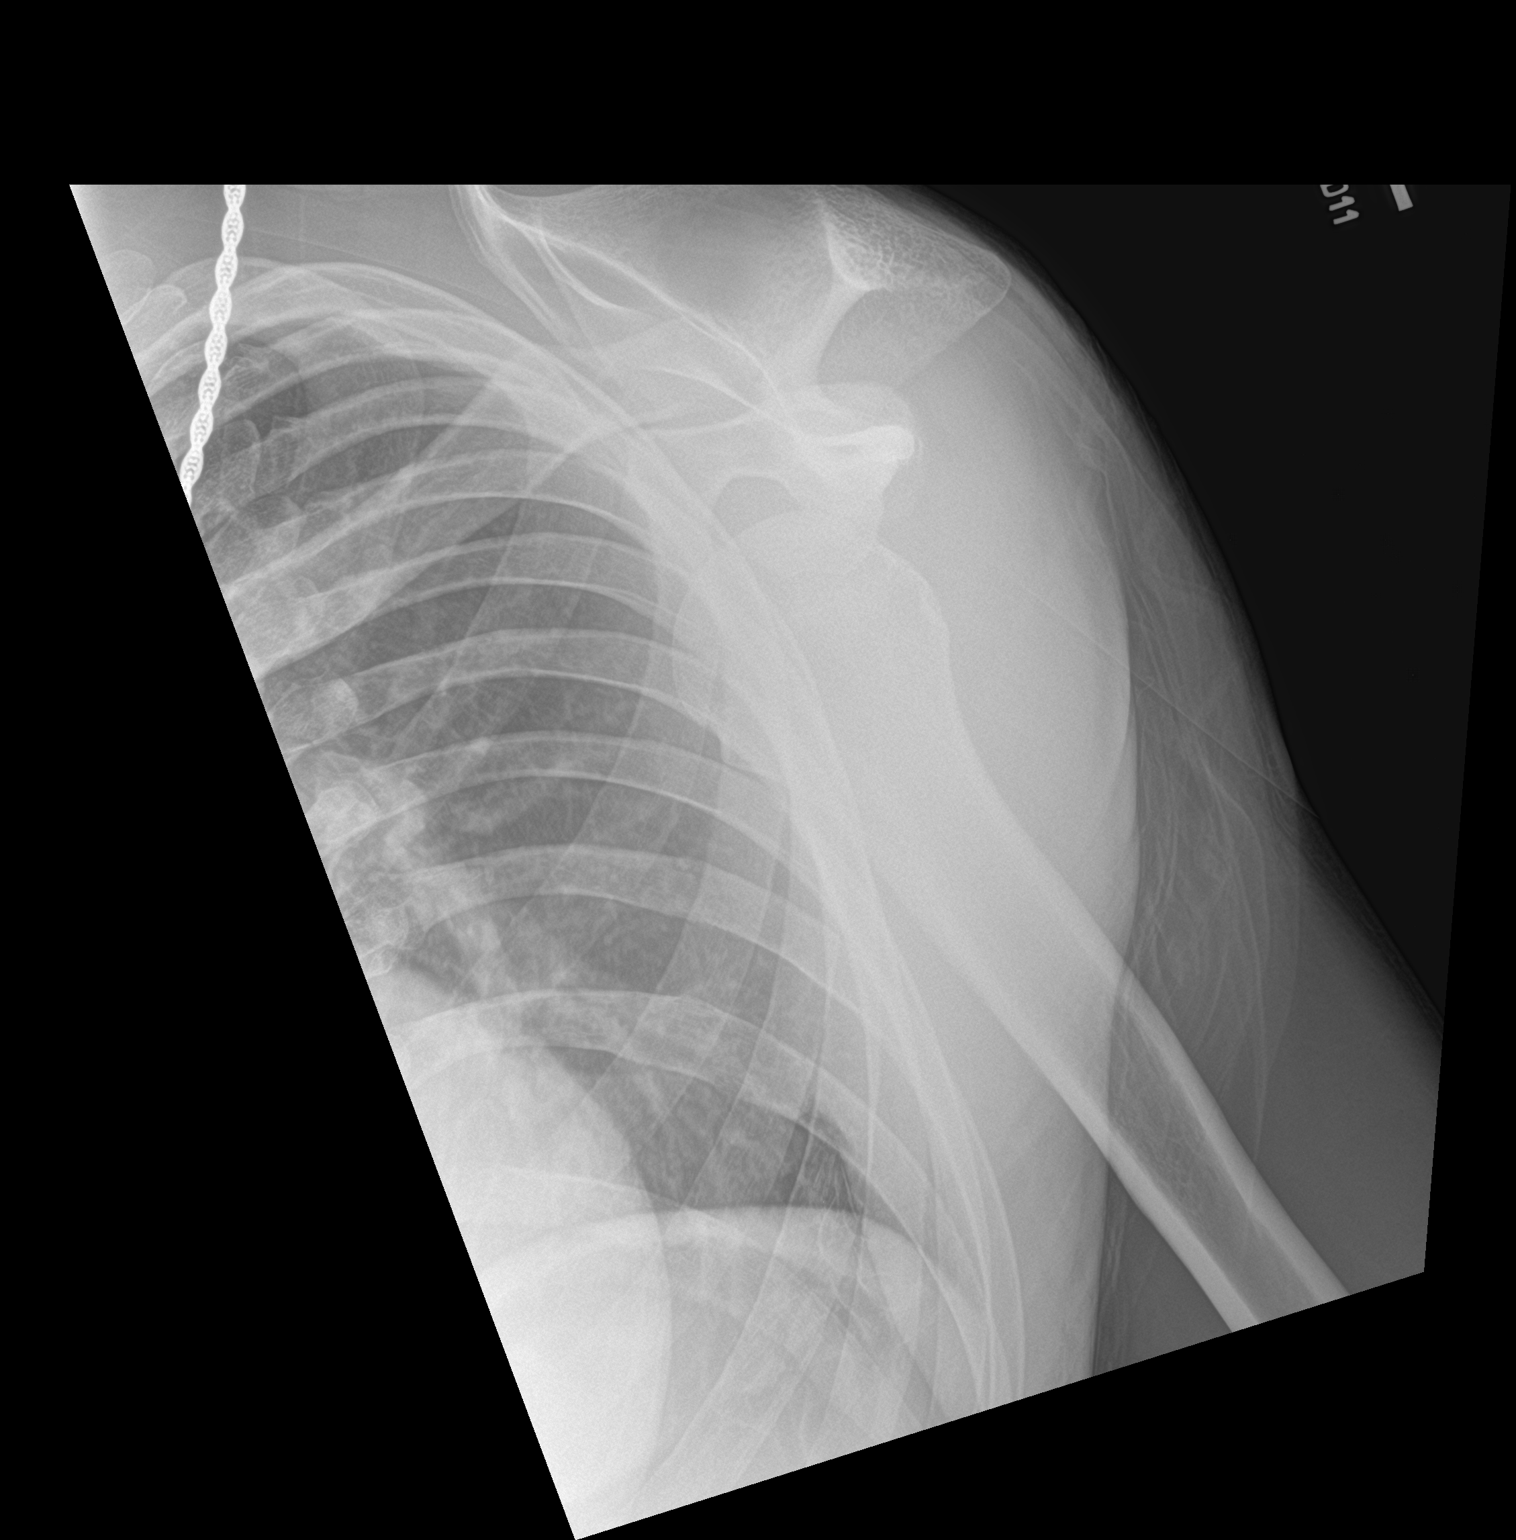

[shoulder ap neutral]
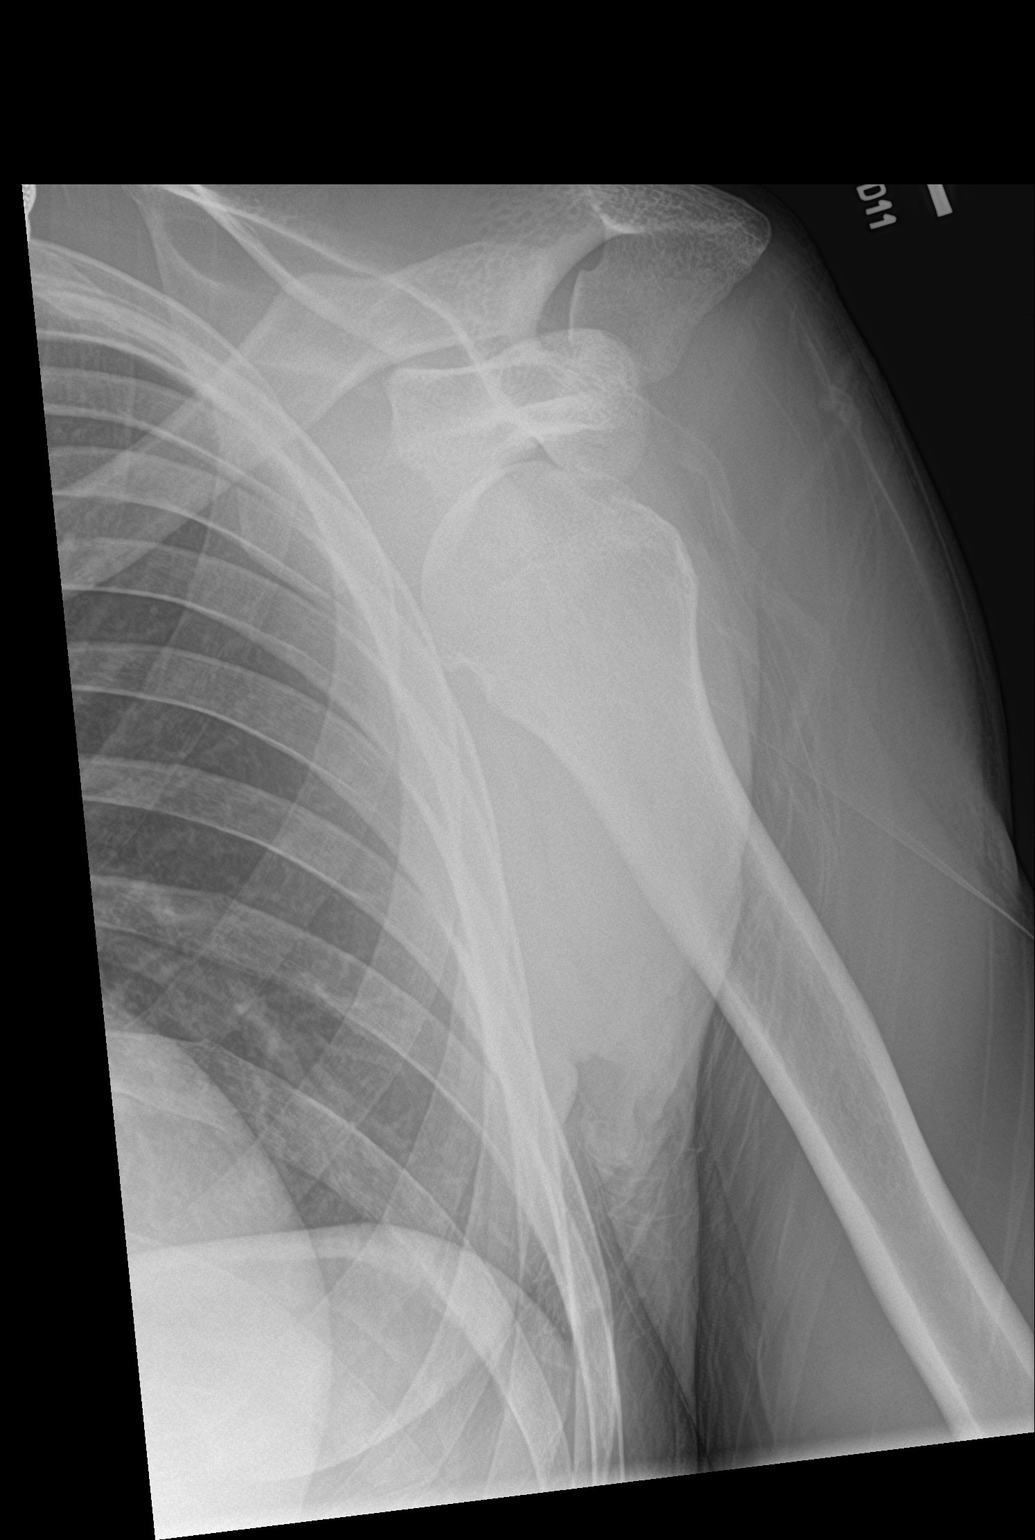

[3 of 3 positions shown; findings below may reference images not displayed]

FINDINGS: There is anterior dislocation of the left shoulder. No associated
acute fracture is seen. A chronic fracture of the distal left
clavicle is noted. Soft tissues are unremarkable.
IMPRESSION: Anterior dislocation of the left shoulder without associated acute
fracture.

## 2022-05-09 ENCOUNTER — Emergency Department (HOSPITAL_COMMUNITY): Payer: Commercial Managed Care - HMO

## 2022-05-09 ENCOUNTER — Emergency Department (HOSPITAL_COMMUNITY)
Admission: EM | Admit: 2022-05-09 | Discharge: 2022-05-09 | Disposition: A | Discharge status: Home / Self Care | Payer: Commercial Managed Care - HMO | Attending: Emergency Medicine | Admitting: Emergency Medicine

## 2022-05-09 ENCOUNTER — Other Ambulatory Visit: Payer: Self-pay

## 2022-05-09 DIAGNOSIS — M79644 Pain in right finger(s): Secondary | ICD-10-CM | POA: Diagnosis present

## 2022-05-09 DIAGNOSIS — S63641A Sprain of metacarpophalangeal joint of right thumb, initial encounter: Secondary | ICD-10-CM

## 2022-05-09 MED ORDER — IBUPROFEN 200 MG PO TABS
400.0000 mg | ORAL_TABLET | Freq: Once | ORAL | Status: AC | PRN
  Administered 2022-05-09: 400 mg via ORAL
  Filled 2022-05-09: qty 2

## 2022-05-09 MED ORDER — IBUPROFEN 800 MG PO TABS: 800.0000 mg | ORAL_TABLET | Freq: Three times a day (TID) | ORAL | 0 refills | Status: AC

## 2022-05-09 NOTE — ED Provider Notes (Signed)
[place] DEPT Provider Note   CSN: [id number] Arrival date & time: 05/09/22  0459     History  Chief Complaint  Patient presents with   Finger Injury    Warren Holland is a 26 y.o. male.  HPI   Patient with noncontributory past medical history presents today due to right thumb pain.  Started 3 days ago atraumatically.  States he woke up and the thumb was painful and hard to move.  This been worsening over the past 2 days, it hurts at rest and with movement.  It feels like a throbbing pain.  The pain radiates from the right thumb to the mid forearm, worse with movement and palpation.  He has taken ibuprofen with minimal improvement.  No fevers or chills, denies any purulent drainage or rashes that he is noticed.  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  Denies any recent trauma or injury to the thumb.  Denies any sensation deficits.  Home Medications Prior to Admission medications   Not on File      Allergies    Patient has no known allergies.    Review of Systems   Review of Systems  Physical Exam Updated Vital Signs BP 133/82 (BP Location: Left Arm)   Pulse (!) 57   Temp 98.2 F (36.8 C) (Oral)   Resp 18   Ht 5\' 9"  (1.753 m)   Wt 77.1 kg   SpO2 100%   BMI 25.10 kg/m  Physical Exam Vitals and nursing note reviewed. Exam conducted with a chaperone present.  Constitutional:      General: He is not in acute distress.    Appearance: Normal appearance.  HENT:     Head: Normocephalic and atraumatic.  Eyes:     General: No scleral icterus.    Extraocular Movements: Extraocular movements intact.     Pupils: Pupils are equal, round, and reactive to light.  Cardiovascular:     Pulses: Normal pulses.  Musculoskeletal:        General: Tenderness present.     Comments: No tenderness or change to ROM of elbow, wrist.  Patient is unable to AB duct or abduct the thumb secondary to pain.  Tolerates some passive ROM  significant pain.  But decreased flexion and extension actively.  No crepitus, no fluctuance.  Skin:    Capillary Refill: Capillary refill takes less than 2 seconds.     Coloration: Skin is not jaundiced.     Findings: No rash.  Neurological:     Mental Status: He is alert. Mental status is at baseline.     Coordination: Coordination normal.     Comments: Sensation is circumferentially intact to right thumb     ED Results / Procedures / Treatments   Labs (all labs ordered are listed, but only abnormal results are displayed) Labs Reviewed - No data to display  EKG None  Radiology DG Finger Thumb Right  Result Date: 05/09/2022 CLINICAL DATA:  Thumb pain.  Unknown injury. EXAM: RIGHT THUMB 3V COMPARISON:  11/25/2011 FINDINGS: No acute fracture or dislocation. Small calcific densities around the ulnar aspect of the MCP joint, without donor site. No erosion or joint space narrowing. IMPRESSION: Small calcifications medial to the first MCP joint, question underlying collateral ligament injury. No erosion or acute fracture. Electronically Signed   By: Jorje Guild M.D.   On: 05/09/2022 05:49    Procedures Procedures    Medications Ordered in ED Medications  ibuprofen (ADVIL)  tablet 400 mg (400 mg Oral Given 05/09/22 0540)    ED Course/ Medical Decision Making/ A&P                           Medical Decision Making Amount and/or Complexity of Data Reviewed Radiology: ordered.  Risk OTC drugs. Prescription drug management.   Patient presents due to right thumb pain.  He is neurovascular intact with brisk cap refill, no erythema or signs of cellulitis.  Tolerates passive ROM, no crepitus.  Decreased active ROM.  Presentation is not consistent with flexor tenosynovitis, cellulitis, fracture, dislocation, nerve injury.  I reviewed imaging, concerning for collateral ligamental injury.  Thumb sicca splint provided, anti-inflammatories given.  Will refer for hand surgery for  additional follow-up.  Return precautions were discussed, discharge stable condition.        Final Clinical Impression(s) / ED Diagnoses Final diagnoses:  None    Rx / DC Orders ED Discharge Orders     None         Sherrill Raring, PA-C 05/09/22 Sasser, [place], DO 05/10/22 [phone]

## 2022-05-09 NOTE — Discharge Instructions (Addendum)
Wear the thumb brace throughout the day, he can remove when showering.  Take ibuprofen 800 mg with food and water 3 times daily.  Call the number above to schedule follow-up with hand surgery for early next week.  I suspect you have sprained one of your ligaments, you need to be followed up with them.  If you develop fevers, chills, spreading redness or significant swelling need to return back to the ED for additional evaluation.

## 2022-05-09 NOTE — ED Triage Notes (Signed)
Patient reports Rt thumb pain unknown injury hurting X 3 days

## 2022-11-30 ENCOUNTER — Emergency Department (HOSPITAL_COMMUNITY)
Admission: EM | Admit: 2022-11-30 | Discharge: 2022-11-30 | Disposition: A | Discharge status: Home / Self Care | Payer: BLUE CROSS/BLUE SHIELD | Attending: Emergency Medicine | Admitting: Emergency Medicine

## 2022-11-30 ENCOUNTER — Other Ambulatory Visit: Payer: Self-pay

## 2022-11-30 DIAGNOSIS — S81812A Laceration without foreign body, left lower leg, initial encounter: Secondary | ICD-10-CM | POA: Diagnosis present

## 2022-11-30 DIAGNOSIS — W16722A Jumping or diving from boat striking bottom causing other injury, initial encounter: Secondary | ICD-10-CM | POA: Insufficient documentation

## 2022-11-30 NOTE — ED Triage Notes (Signed)
Pt states he jumped off a dock at work and "cut" his left shin. Bleeding controlled in triage.

## 2022-11-30 NOTE — ED Notes (Signed)
Pt was began to feel flushed and dizzy, pt b/p 82/49. Laid pt back in chair and pt b/p now at 102/59

## 2022-11-30 NOTE — ED Provider Notes (Signed)
[place] EMERGENCY DEPARTMENT AT [facility] Provider Note   CSN: [id number] Arrival date & time: 11/30/22  1725     History  Chief Complaint  Patient presents with   Leg Injury    Warren Holland is a 27 y.o. male.  Reports he was attempting to jump up onto a dock and struck his left leg mid shin.  Patient complains of a laceration to his leg and patient does not think he struck it hard enough to break anything patient complains of a small area of bleeding.  Does not have any numbness or tingling  The history is provided by the patient. No language interpreter was used.       Home Medications Prior to Admission medications   Medication Sig Start Date End Date Taking? Authorizing Provider  ibuprofen (ADVIL) 800 MG tablet Take 1 tablet (800 mg total) by mouth 3 (three) times daily. 05/09/22   Theron Arista, PA-C      Allergies    Patient has no known allergies.    Review of Systems   Review of Systems  All other systems reviewed and are negative.   Physical Exam Updated Vital Signs BP 121/63 (BP Location: Right Arm)   Pulse 76   Temp 98.1 F (36.7 C) (Oral)   Resp 16   Ht 5\' 9"  (1.753 m)   Wt 77 kg   SpO2 100%   BMI 25.07 kg/m  Physical Exam Vitals and nursing note reviewed.  Constitutional:      Appearance: He is well-developed.  HENT:     Head: Normocephalic.  Pulmonary:     Effort: Pulmonary effort is normal.  Abdominal:     General: There is no distension.  Musculoskeletal:     Cervical back: Normal range of motion.     Comments: 1 cm moon shaped laceration left mid shin gaping neurovascular neurosensory are intact  Neurological:     Mental Status: He is alert and oriented to person, place, and time.     ED Results / Procedures / Treatments   Labs (all labs ordered are listed, but only abnormal results are displayed) Labs Reviewed - No data to display  EKG None  Radiology No results found.  Procedures .Marland KitchenLaceration  Repair  Date/Time: 11/30/2022 9:37 PM  Performed by: Elson Areas, PA-C Authorized by: Elson Areas, PA-C   Consent:    Consent obtained:  Verbal   Consent given by:  Patient   Risks discussed:  Infection Universal protocol:    Procedure explained and questions answered to patient or proxy's satisfaction: yes     Immediately prior to procedure, a time out was called: yes     Patient identity confirmed:  Verbally with patient Laceration details:    Length (cm):  1 Exploration:    Wound exploration: wound explored through full range of motion   Treatment:    Area cleansed with:  Shur-Clens   Irrigation solution:  Sterile saline   Debridement:  None   Scar revision: no   Skin repair:    Repair method:  Tissue adhesive Approximation:    Approximation:  Loose Repair type:    Repair type:  Simple Post-procedure details:    Procedure completion:  Tolerated Comments:     Small area of wound continued to ooze, used a Steri-Strip over this area     Medications Ordered in ED Medications - No data to display  ED Course/ Medical Decision Making/ A&P  Medical Decision Making Patient cut his left leg jumping onto a dock  Risk Risk Details: Patient does not want sutures.  He states he is afraid of needles.  I advised him the outcome with other closure would be less cosmetic.  He is not concerned.           Final Clinical Impression(s) / ED Diagnoses Final diagnoses:  Laceration of left lower extremity, initial encounter    Rx / DC Orders ED Discharge Orders     None     An After Visit Summary was printed and given to the patient.     Osie Cheeks 11/30/22 2139    Linwood Dibbles, MD 12/01/22 [phone]

## 2022-11-30 NOTE — Discharge Instructions (Signed)
Return if any problems.
